# Patient Record
Sex: Female | Born: 1945
Health system: Southern US, Community
[De-identification: ages and names within clinical notes are randomized; demographics above are authoritative.]

## PROBLEM LIST (undated history)

## (undated) DIAGNOSIS — F419 Anxiety disorder, unspecified: Secondary | ICD-10-CM

## (undated) DIAGNOSIS — K589 Irritable bowel syndrome without diarrhea: Secondary | ICD-10-CM

## (undated) DIAGNOSIS — J439 Emphysema, unspecified: Secondary | ICD-10-CM

## (undated) DIAGNOSIS — F329 Major depressive disorder, single episode, unspecified: Secondary | ICD-10-CM

## (undated) DIAGNOSIS — E785 Hyperlipidemia, unspecified: Secondary | ICD-10-CM

## (undated) DIAGNOSIS — K219 Gastro-esophageal reflux disease without esophagitis: Secondary | ICD-10-CM

## (undated) DIAGNOSIS — K449 Diaphragmatic hernia without obstruction or gangrene: Secondary | ICD-10-CM

## (undated) DIAGNOSIS — K5790 Diverticulosis of intestine, part unspecified, without perforation or abscess without bleeding: Secondary | ICD-10-CM

## (undated) DIAGNOSIS — I10 Essential (primary) hypertension: Secondary | ICD-10-CM

## (undated) DIAGNOSIS — I4891 Unspecified atrial fibrillation: Secondary | ICD-10-CM

## (undated) DIAGNOSIS — F32A Depression, unspecified: Secondary | ICD-10-CM

## (undated) HISTORY — DX: Diaphragmatic hernia without obstruction or gangrene: K44.9

## (undated) HISTORY — DX: Emphysema, unspecified: J43.9

## (undated) HISTORY — DX: Gastro-esophageal reflux disease without esophagitis: K21.9

## (undated) HISTORY — DX: Hyperlipidemia, unspecified: E78.5

## (undated) HISTORY — PX: TUBAL LIGATION: SHX77

## (undated) HISTORY — PX: COLONOSCOPY: SHX174

## (undated) HISTORY — PX: PARTIAL HYSTERECTOMY: SHX80

## (undated) HISTORY — DX: Anxiety disorder, unspecified: F41.9

## (undated) HISTORY — DX: Diverticulosis of intestine, part unspecified, without perforation or abscess without bleeding: K57.90

## (undated) HISTORY — DX: Unspecified atrial fibrillation: I48.91

## (undated) HISTORY — DX: Irritable bowel syndrome, unspecified: K58.9

## (undated) HISTORY — PX: TONSILLECTOMY: SUR1361

---

## 1999-01-02 ENCOUNTER — Emergency Department (HOSPITAL_COMMUNITY): Admission: EM | Admit: 1999-01-02 | Discharge: 1999-01-02 | Payer: Self-pay | Admitting: Emergency Medicine

## 1999-04-17 ENCOUNTER — Other Ambulatory Visit: Admission: RE | Admit: 1999-04-17 | Discharge: 1999-04-17 | Payer: Self-pay | Admitting: Internal Medicine

## 2000-01-04 ENCOUNTER — Encounter: Payer: Self-pay | Admitting: Internal Medicine

## 2000-01-04 ENCOUNTER — Encounter: Admission: RE | Admit: 2000-01-04 | Discharge: 2000-01-04 | Payer: Self-pay | Admitting: Internal Medicine

## 2000-05-21 ENCOUNTER — Other Ambulatory Visit: Admission: RE | Admit: 2000-05-21 | Discharge: 2000-05-21 | Payer: Self-pay | Admitting: Internal Medicine

## 2001-02-26 ENCOUNTER — Encounter: Admission: RE | Admit: 2001-02-26 | Discharge: 2001-02-26 | Payer: Self-pay | Admitting: Internal Medicine

## 2001-02-26 ENCOUNTER — Encounter: Payer: Self-pay | Admitting: Internal Medicine

## 2003-08-30 ENCOUNTER — Other Ambulatory Visit: Admission: RE | Admit: 2003-08-30 | Discharge: 2003-08-30 | Payer: Self-pay | Admitting: Family Medicine

## 2003-09-23 ENCOUNTER — Ambulatory Visit (HOSPITAL_COMMUNITY): Admission: RE | Admit: 2003-09-23 | Discharge: 2003-09-23 | Payer: Self-pay | Admitting: Family Medicine

## 2005-03-11 ENCOUNTER — Encounter: Admission: RE | Admit: 2005-03-11 | Discharge: 2005-03-11 | Payer: Self-pay | Admitting: Obstetrics and Gynecology

## 2009-04-11 ENCOUNTER — Other Ambulatory Visit: Admission: RE | Admit: 2009-04-11 | Discharge: 2009-04-11 | Payer: Self-pay | Admitting: Family Medicine

## 2009-04-11 ENCOUNTER — Encounter: Admission: RE | Admit: 2009-04-11 | Discharge: 2009-04-11 | Payer: Self-pay | Admitting: Family Medicine

## 2009-04-17 ENCOUNTER — Encounter: Admission: RE | Admit: 2009-04-17 | Discharge: 2009-04-17 | Payer: Self-pay | Admitting: Family Medicine

## 2009-09-05 ENCOUNTER — Encounter: Admission: RE | Admit: 2009-09-05 | Discharge: 2009-09-05 | Payer: Self-pay | Admitting: Family Medicine

## 2009-09-07 ENCOUNTER — Ambulatory Visit (HOSPITAL_COMMUNITY): Admission: RE | Admit: 2009-09-07 | Discharge: 2009-09-07 | Payer: Self-pay | Admitting: Family Medicine

## 2011-02-19 ENCOUNTER — Other Ambulatory Visit: Payer: Self-pay | Admitting: Family Medicine

## 2011-02-20 ENCOUNTER — Ambulatory Visit
Admission: RE | Admit: 2011-02-20 | Discharge: 2011-02-20 | Disposition: A | Discharge status: Home / Self Care | Payer: Medicare Other | Source: Ambulatory Visit | Attending: Family Medicine | Admitting: Family Medicine

## 2011-02-20 MED ORDER — IOHEXOL 300 MG/ML  SOLN
100.0000 mL | Freq: Once | INTRAMUSCULAR | Status: AC | PRN
  Administered 2011-02-20: 100 mL via INTRAVENOUS

## 2011-02-25 ENCOUNTER — Other Ambulatory Visit: Payer: Self-pay | Admitting: Family Medicine

## 2011-02-25 DIAGNOSIS — N289 Disorder of kidney and ureter, unspecified: Secondary | ICD-10-CM

## 2011-02-27 ENCOUNTER — Other Ambulatory Visit: Payer: Medicare Other

## 2011-03-02 ENCOUNTER — Ambulatory Visit
Admission: RE | Admit: 2011-03-02 | Discharge: 2011-03-02 | Disposition: A | Discharge status: Home / Self Care | Payer: Medicare Other | Source: Ambulatory Visit | Attending: Family Medicine | Admitting: Family Medicine

## 2011-03-02 DIAGNOSIS — N289 Disorder of kidney and ureter, unspecified: Secondary | ICD-10-CM

## 2011-03-02 MED ORDER — GADOBENATE DIMEGLUMINE 529 MG/ML IV SOLN
12.0000 mL | Freq: Once | INTRAVENOUS | Status: AC | PRN
  Administered 2011-03-02: 12 mL via INTRAVENOUS

## 2011-11-22 ENCOUNTER — Emergency Department (HOSPITAL_COMMUNITY)
Admission: EM | Admit: 2011-11-22 | Discharge: 2011-11-22 | Disposition: A | Discharge status: Home / Self Care | Payer: Medicare Other | Attending: Emergency Medicine | Admitting: Emergency Medicine

## 2011-11-22 ENCOUNTER — Encounter (HOSPITAL_COMMUNITY): Payer: Self-pay | Admitting: *Deleted

## 2011-11-22 ENCOUNTER — Emergency Department (HOSPITAL_COMMUNITY): Payer: Medicare Other

## 2011-11-22 DIAGNOSIS — R42 Dizziness and giddiness: Secondary | ICD-10-CM

## 2011-11-22 DIAGNOSIS — R109 Unspecified abdominal pain: Secondary | ICD-10-CM | POA: Insufficient documentation

## 2011-11-22 DIAGNOSIS — Z79899 Other long term (current) drug therapy: Secondary | ICD-10-CM | POA: Insufficient documentation

## 2011-11-22 DIAGNOSIS — K922 Gastrointestinal hemorrhage, unspecified: Secondary | ICD-10-CM

## 2011-11-22 DIAGNOSIS — I10 Essential (primary) hypertension: Secondary | ICD-10-CM | POA: Insufficient documentation

## 2011-11-22 DIAGNOSIS — R269 Unspecified abnormalities of gait and mobility: Secondary | ICD-10-CM | POA: Insufficient documentation

## 2011-11-22 DIAGNOSIS — R10816 Epigastric abdominal tenderness: Secondary | ICD-10-CM | POA: Insufficient documentation

## 2011-11-22 HISTORY — DX: Depression, unspecified: F32.A

## 2011-11-22 HISTORY — DX: Major depressive disorder, single episode, unspecified: F32.9

## 2011-11-22 HISTORY — DX: Essential (primary) hypertension: I10

## 2011-11-22 LAB — CBC
HCT: 41.4 % (ref 36.0–46.0)
Hemoglobin: 14 g/dL (ref 12.0–15.0)
MCHC: 34.4 g/dL (ref 30.0–36.0)
MCHC: 33.8 g/dL (ref 30.0–36.0)
Platelets: 221 10*3/uL (ref 150–400)
RBC: 4.44 MIL/uL (ref 3.87–5.11)
RDW: 12.9 % (ref 11.5–15.5)
WBC: 11.9 10*3/uL — ABNORMAL HIGH (ref 4.0–10.5)

## 2011-11-22 LAB — BASIC METABOLIC PANEL
BUN: 13 mg/dL (ref 6–23)
CO2: 29 mEq/L (ref 19–32)
Chloride: 102 mEq/L (ref 96–112)
Creatinine, Ser: 0.74 mg/dL (ref 0.50–1.10)
Glucose, Bld: 100 mg/dL — ABNORMAL HIGH (ref 70–99)
Potassium: 3.6 mEq/L (ref 3.5–5.1)

## 2011-11-22 LAB — COMPREHENSIVE METABOLIC PANEL
ALT: 12 U/L (ref 0–35)
AST: 14 U/L (ref 0–37)
Calcium: 10.1 mg/dL (ref 8.4–10.5)
Creatinine, Ser: 0.84 mg/dL (ref 0.50–1.10)
GFR calc Af Amer: 82 mL/min — ABNORMAL LOW (ref >90)
GFR calc non Af Amer: 70 mL/min — ABNORMAL LOW (ref >90)
Glucose, Bld: 101 mg/dL — ABNORMAL HIGH (ref 70–99)
Sodium: 143 mEq/L (ref 135–145)
Total Protein: 7.3 g/dL (ref 6.0–8.3)

## 2011-11-22 LAB — DIFFERENTIAL
Basophils Relative: 0 % (ref 0–1)
Lymphs Abs: 2.8 10*3/uL (ref 0.7–4.0)
Monocytes Absolute: 0.7 10*3/uL (ref 0.1–1.0)
Monocytes Relative: 6 % (ref 3–12)
Neutro Abs: 7.7 10*3/uL (ref 1.7–7.7)
Neutrophils Relative %: 67 % (ref 43–77)

## 2011-11-22 LAB — SAMPLE TO BLOOD BANK

## 2011-11-22 MED ORDER — GADOBENATE DIMEGLUMINE 529 MG/ML IV SOLN
15.0000 mL | Freq: Once | INTRAVENOUS | Status: AC | PRN
  Administered 2011-11-22: 15 mL via INTRAVENOUS

## 2011-11-22 MED ORDER — SODIUM CHLORIDE 0.9 % IV SOLN
INTRAVENOUS | Status: DC
  Administered 2011-11-22: 19:00:00 via INTRAVENOUS

## 2011-11-22 MED ORDER — MECLIZINE HCL 25 MG PO TABS
25.0000 mg | ORAL_TABLET | Freq: Once | ORAL | Status: AC
  Administered 2011-11-22: 25 mg via ORAL
  Filled 2011-11-22: qty 1

## 2011-11-22 MED ORDER — PANTOPRAZOLE SODIUM 40 MG IV SOLR
40.0000 mg | Freq: Once | INTRAVENOUS | Status: AC
  Administered 2011-11-22: 40 mg via INTRAVENOUS
  Filled 2011-11-22 (×2): qty 40

## 2011-11-22 MED ORDER — MECLIZINE HCL 50 MG PO TABS: 25.0000 mg | ORAL_TABLET | Freq: Three times a day (TID) | ORAL | Status: AC | PRN

## 2011-11-22 MED ORDER — ONDANSETRON HCL 4 MG/2ML IJ SOLN
4.0000 mg | Freq: Once | INTRAMUSCULAR | Status: AC
  Administered 2011-11-22: 4 mg via INTRAVENOUS
  Filled 2011-11-22 (×2): qty 2

## 2011-11-22 NOTE — ED Notes (Signed)
Pt reports dizziness with vomiting. Stroke scale negative. Pt drinking something from [employer], pt has given it to husband.

## 2011-11-22 NOTE — ED Notes (Signed)
Pt reports waking up feeling dizzy.  Vomiting started around 14:30.  She has thrown up everything she has eaten today which she reports as "pot pie, breakfast sandwich, and hot chocolate."  The last thing she vomited was blood. Called her PCP and he recommended that she come to the ER.

## 2011-11-22 NOTE — ED Notes (Signed)
Pt states that she has been nauseated and vomited today on the second time she vomited she states that she vomited blood

## 2011-11-22 NOTE — ED Notes (Addendum)
Pt walked to bathroom on Pod A with no trouble from room 6. Pt states that on her way back to room 6 she felt dizzy and was unable to walk without support. EDP Caparossi aware and examed pt. Pt able to follow commands and perform tests, but when walking she was stumbling. Spoke to MRI aware pt is here and needs MRI of brain.

## 2011-11-22 NOTE — ED Provider Notes (Addendum)
History     CSN: [id number]  Arrival date & time 11/22/11  1535   First MD Initiated Contact with Patient 11/22/11 1727      Chief Complaint  Patient presents with  . GI Bleeding  . Dizziness    (Consider location/radiation/quality/duration/timing/severity/associated sxs/prior treatment) The history is provided by the patient and the spouse.   the patient is a 66 year old, female, with a history of hypertension, and osteoporosis, who presents emergency department complaining of dizziness with several episodes of nausea, vomiting, and a single episode of copious, hematemesis.  She says she woke up with these sxs.  She has mild abdominal discomfort, as well.  She takes Fosamax for her osteoporosis.  She has never had peptic ulcer disease in the past.  She denies alcohol use.  She denies recent illness.  She has not noted blood in her stool.  She smokes.  She denies ha or vision changes.  She says when she walks she feels "swimmy headed."  Past Medical History  Diagnosis Date  . Depression   . Hypertension     Past Surgical History  Procedure Date  . Tonsillectomy     History reviewed. No pertinent family history.  History  Substance Use Topics  . Smoking status: Current Everyday Smoker    Types: Cigarettes  . Smokeless tobacco: Not on file  . Alcohol Use: No    OB History    Grav Para Term Preterm Abortions TAB SAB Ect Mult Living                  Review of Systems  Constitutional: Negative for fever, chills and diaphoresis.  HENT: Negative for ear pain.   Respiratory: Negative for cough, chest tightness and shortness of breath.   Cardiovascular: Negative for chest pain.  Gastrointestinal: Positive for nausea, vomiting and abdominal pain. Negative for blood in stool.       Mild abdominal pain  Genitourinary: Negative for dysuria.  Neurological: Positive for dizziness and light-headedness. Negative for headaches.       Dizziness is lightheadedness.  It is not  vertigo  Psychiatric/Behavioral: Negative for confusion.  All other systems reviewed and are negative.    Allergies  Wellbutrin  Home Medications   Current Outpatient Rx  Name Route Sig Dispense Refill  . ALENDRONATE SODIUM 70 MG PO TABS Oral Take 70 mg by mouth every 7 (seven) days. Sundays or Mondays per pt; Take with a full glass of water on an empty stomach.    Marland Kitchen CITALOPRAM HYDROBROMIDE 10 MG PO TABS Oral Take 10 mg by mouth at bedtime.    Marland Kitchen LISINOPRIL 20 MG PO TABS Oral Take 20 mg by mouth every morning.      BP 130/97  Pulse 67  Temp(Src) 98.9 F (37.2 C) (Oral)  Resp 18  SpO2 100%  Physical Exam  Vitals reviewed. Constitutional: She is oriented to person, place, and time. She appears well-developed and well-nourished. No distress.  HENT:  Head: Normocephalic and atraumatic.  Eyes: Conjunctivae are normal.       Normal pink conjunctiva  Neck: Normal range of motion. Neck supple.  Cardiovascular: Normal rate.   No murmur heard.      2+ radial pulses bilaterally  Pulmonary/Chest: Effort normal and breath sounds normal. No respiratory distress.  Abdominal: Soft. Bowel sounds are normal. She exhibits no distension. There is tenderness.       Mild epigastric tenderness, with no peritoneal  Genitourinary: Guaiac negative stool.  Rectal examination performed with nurse chaperone  Musculoskeletal: Normal range of motion. She exhibits no edema.  Neurological: She is alert and oriented to person, place, and time. She has normal strength. No cranial nerve deficit. She displays a negative Romberg sign. Gait abnormal. Coordination normal.       Wide based unsteady gait No pronator drift No nystagmus  Skin: Skin is warm and dry.  Psychiatric: She has a normal mood and affect. Thought content normal.    ED Course  Procedures (including critical care time) 66 year old, female, with hematemesis, earlier today.  No diarrhea, and no blood in stool.  She has not had peptic  ulcer disease in the past and does not drink alcohol.  However, she does take Fosamax.  The bi phosphonate, which may cause ulcers as well We'll establish IV give proton ex and perform laboratory testing for further evaluation  Labs Reviewed  CBC - Abnormal; Notable for the following:    WBC 11.9 (*)    All other components within normal limits  COMPREHENSIVE METABOLIC PANEL - Abnormal; Notable for the following:    Glucose, Bld 101 (*)    Total Bilirubin 0.2 (*)    GFR calc non Af Amer 70 (*)    GFR calc Af Amer 82 (*)    All other components within normal limits  SAMPLE TO BLOOD BANK  OCCULT BLOOD, POC DEVICE  CBC  DIFFERENTIAL  BASIC METABOLIC PANEL   No results found.   No diagnosis found.  Pt just walked to bathroom.  Feels dizzy/unsteady.  Says she felt as if she would fall off toilet.    MDM  GI bleed dizziness        Cheri Guppy, MD 11/22/11 2002  Cheri Guppy, MD 11/22/11 2157

## 2011-11-22 NOTE — ED Notes (Signed)
Pt with hx of dizziness in the past woke up this am at 7am (possibly earlier) with dizziness.  Pt went to bed last night and did not have dizziness at that time.  Pt denies any CP or sob with this.  No neuro deficits, or focal weakness with this.  Pt is alert and oriented in triage

## 2011-11-22 NOTE — Discharge Instructions (Signed)
Your MRI, does show any signs of a stroke or tumor.  Your blood tests do not show any significant blood loss.  Decrease your citalopram.  Stop smoking.  Use meclizine for dizziness.  Followup with your doctor next week for reevaluation.  Return for worse or uncontrolled symptoms

## 2012-03-18 ENCOUNTER — Other Ambulatory Visit: Payer: Self-pay | Admitting: Family Medicine

## 2012-04-09 ENCOUNTER — Encounter (HOSPITAL_COMMUNITY): Payer: Self-pay

## 2012-04-09 ENCOUNTER — Encounter (HOSPITAL_COMMUNITY)
Admission: RE | Admit: 2012-04-09 | Discharge: 2012-04-09 | Disposition: A | Discharge status: Home / Self Care | Payer: Medicare Other | Source: Ambulatory Visit | Attending: Family Medicine | Admitting: Family Medicine

## 2012-04-09 DIAGNOSIS — M810 Age-related osteoporosis without current pathological fracture: Secondary | ICD-10-CM | POA: Insufficient documentation

## 2012-04-09 MED ORDER — SODIUM CHLORIDE 0.9 % IV SOLN
Freq: Once | INTRAVENOUS | Status: AC
  Administered 2012-04-09: 14:00:00 via INTRAVENOUS

## 2012-04-09 MED ORDER — ZOLEDRONIC ACID 5 MG/100ML IV SOLN
5.0000 mg | Freq: Once | INTRAVENOUS | Status: AC
  Administered 2012-04-09: 5 mg via INTRAVENOUS
  Filled 2012-04-09: qty 100

## 2013-07-26 ENCOUNTER — Other Ambulatory Visit: Payer: Self-pay

## 2013-07-26 DIAGNOSIS — Z1231 Encounter for screening mammogram for malignant neoplasm of breast: Secondary | ICD-10-CM

## 2013-07-27 ENCOUNTER — Ambulatory Visit
Admission: RE | Admit: 2013-07-27 | Discharge: 2013-07-27 | Disposition: A | Discharge status: Home / Self Care | Payer: Medicare Other | Source: Ambulatory Visit | Attending: Family Medicine | Admitting: Family Medicine

## 2013-07-27 ENCOUNTER — Other Ambulatory Visit: Payer: Self-pay | Admitting: Family Medicine

## 2013-07-27 DIAGNOSIS — R059 Cough, unspecified: Secondary | ICD-10-CM

## 2013-07-27 DIAGNOSIS — R05 Cough: Secondary | ICD-10-CM

## 2013-08-20 ENCOUNTER — Ambulatory Visit
Admission: RE | Admit: 2013-08-20 | Discharge: 2013-08-20 | Disposition: A | Discharge status: Home / Self Care | Payer: Medicare Other | Source: Ambulatory Visit

## 2013-08-20 DIAGNOSIS — Z1231 Encounter for screening mammogram for malignant neoplasm of breast: Secondary | ICD-10-CM

## 2013-09-30 ENCOUNTER — Other Ambulatory Visit: Payer: Self-pay | Admitting: Family Medicine

## 2013-09-30 ENCOUNTER — Other Ambulatory Visit (HOSPITAL_COMMUNITY)
Admission: RE | Admit: 2013-09-30 | Discharge: 2013-09-30 | Disposition: A | Discharge status: Home / Self Care | Payer: Medicare Other | Source: Ambulatory Visit | Attending: Family Medicine | Admitting: Family Medicine

## 2013-09-30 DIAGNOSIS — Z124 Encounter for screening for malignant neoplasm of cervix: Secondary | ICD-10-CM | POA: Insufficient documentation

## 2014-02-28 ENCOUNTER — Other Ambulatory Visit: Payer: Self-pay | Admitting: Gastroenterology

## 2014-02-28 DIAGNOSIS — R1032 Left lower quadrant pain: Secondary | ICD-10-CM

## 2014-03-03 ENCOUNTER — Ambulatory Visit
Admission: RE | Admit: 2014-03-03 | Discharge: 2014-03-03 | Disposition: A | Discharge status: Home / Self Care | Payer: Medicare Other | Source: Ambulatory Visit | Attending: Gastroenterology | Admitting: Gastroenterology

## 2014-03-03 DIAGNOSIS — R1032 Left lower quadrant pain: Secondary | ICD-10-CM

## 2014-03-03 MED ORDER — IOHEXOL 300 MG/ML  SOLN
100.0000 mL | Freq: Once | INTRAMUSCULAR | Status: AC | PRN
  Administered 2014-03-03: 100 mL via INTRAVENOUS

## 2016-08-10 ENCOUNTER — Encounter (HOSPITAL_COMMUNITY): Payer: Self-pay | Admitting: Emergency Medicine

## 2016-08-10 ENCOUNTER — Emergency Department (HOSPITAL_COMMUNITY)
Admission: EM | Admit: 2016-08-10 | Discharge: 2016-08-11 | Disposition: A | Discharge status: Home / Self Care | Payer: Medicare Other | Attending: Emergency Medicine | Admitting: Emergency Medicine

## 2016-08-10 ENCOUNTER — Emergency Department (HOSPITAL_COMMUNITY): Payer: Medicare Other

## 2016-08-10 DIAGNOSIS — K5793 Diverticulitis of intestine, part unspecified, without perforation or abscess with bleeding: Secondary | ICD-10-CM | POA: Diagnosis not present

## 2016-08-10 DIAGNOSIS — I10 Essential (primary) hypertension: Secondary | ICD-10-CM | POA: Insufficient documentation

## 2016-08-10 DIAGNOSIS — R1032 Left lower quadrant pain: Secondary | ICD-10-CM

## 2016-08-10 DIAGNOSIS — F17210 Nicotine dependence, cigarettes, uncomplicated: Secondary | ICD-10-CM | POA: Diagnosis not present

## 2016-08-10 DIAGNOSIS — Z79899 Other long term (current) drug therapy: Secondary | ICD-10-CM | POA: Insufficient documentation

## 2016-08-10 DIAGNOSIS — K5792 Diverticulitis of intestine, part unspecified, without perforation or abscess without bleeding: Secondary | ICD-10-CM

## 2016-08-10 LAB — CBC WITH DIFFERENTIAL/PLATELET
BASOS ABS: 0 10*3/uL (ref 0.0–0.1)
Basophils Relative: 0 %
Eosinophils Absolute: 0.4 10*3/uL (ref 0.0–0.7)
Eosinophils Relative: 5 %
HEMATOCRIT: 42.4 % (ref 36.0–46.0)
HEMOGLOBIN: 14.4 g/dL (ref 12.0–15.0)
LYMPHS PCT: 19 %
Lymphs Abs: 1.8 10*3/uL (ref 0.7–4.0)
MCH: 31.7 pg (ref 26.0–34.0)
MCHC: 34 g/dL (ref 30.0–36.0)
MCV: 93.4 fL (ref 78.0–100.0)
Monocytes Absolute: 1 10*3/uL (ref 0.1–1.0)
Monocytes Relative: 10 %
NEUTROS ABS: 6.3 10*3/uL (ref 1.7–7.7)
NEUTROS PCT: 66 %
Platelets: 215 10*3/uL (ref 150–400)
RBC: 4.54 MIL/uL (ref 3.87–5.11)
RDW: 13.2 % (ref 11.5–15.5)
WBC: 9.5 10*3/uL (ref 4.0–10.5)

## 2016-08-10 LAB — BASIC METABOLIC PANEL
Anion gap: 10 (ref 5–15)
BUN: 11 mg/dL (ref 6–20)
CO2: 25 mmol/L (ref 22–32)
Calcium: 9.2 mg/dL (ref 8.9–10.3)
Chloride: 105 mmol/L (ref 101–111)
Creatinine, Ser: 0.67 mg/dL (ref 0.44–1.00)
GFR calc Af Amer: >60 mL/min (ref >60)
GFR calc non Af Amer: >60 mL/min (ref >60)
Glucose, Bld: 108 mg/dL — ABNORMAL HIGH (ref 65–99)
Potassium: 3.9 mmol/L (ref 3.5–5.1)
Sodium: 140 mmol/L (ref 135–145)

## 2016-08-10 LAB — LIPASE, BLOOD: Lipase: 19 U/L (ref 11–51)

## 2016-08-10 LAB — POC OCCULT BLOOD, ED: Fecal Occult Bld: NEGATIVE

## 2016-08-10 LAB — I-STAT CG4 LACTIC ACID, ED: LACTIC ACID, VENOUS: 0.97 mmol/L (ref 0.5–1.9)

## 2016-08-10 MED ORDER — MORPHINE SULFATE (PF) 4 MG/ML IV SOLN
4.0000 mg | Freq: Once | INTRAVENOUS | Status: DC
  Filled 2016-08-10: qty 1

## 2016-08-10 MED ORDER — SODIUM CHLORIDE 0.9 % IV BOLUS (SEPSIS)
1000.0000 mL | Freq: Once | INTRAVENOUS | Status: AC
  Administered 2016-08-10: 1000 mL via INTRAVENOUS

## 2016-08-10 MED ORDER — IOPAMIDOL (ISOVUE-300) INJECTION 61%
INTRAVENOUS | Status: AC
  Filled 2016-08-10: qty 100

## 2016-08-10 NOTE — ED Notes (Signed)
Pt states she is not having much pain right now and would like to wait on the pain medication. RN showed patient how to call if she starts hurting and that she could have the pain medication whenever she was ready.

## 2016-08-10 NOTE — ED Notes (Signed)
Called lab and added on basic metabolic panel

## 2016-08-10 NOTE — ED Provider Notes (Signed)
Complains of left lower quadrant pain since approximately Christmas 2017. Waxes and wanes. Nothing makes symptoms better or worse she's had 2 or 3 episodes of vomiting since Christmas. Last episode of vomiting today. No hematemesis she also reports 1 episode of diarrhea today no blood per rectum. Maximum temperature 100.1. Presently discomfort is mild. Denies any nausea on exam she is alert and in no distress abdomen nondistended normal active bowel sounds, minimal tenderness of left lower quadrant no guarding rigidity or rebound   Orlie Dakin, MD 08/10/16 2201

## 2016-08-10 NOTE — ED Triage Notes (Signed)
Pt. Stated, I've had my diverticulitis to flare a month ago and its still there. I have an appt. On the 23 but I don't think I can wait thAT LONG. Im having diarrhea and left side abdominal pain

## 2016-08-10 NOTE — ED Provider Notes (Signed)
[facility] DEPT Provider Note   CSN: [id number] Arrival date & time: 08/10/16  1755     History   Chief Complaint Chief Complaint  Patient presents with  . Abdominal Pain    left side  . Diarrhea  . Diverticulitis    HPI URVI BLOCKER is a 71 y.o. female.  HPI   71 year old female presents today with complaints of abdominal pain. Patient reports a long-standing history of irritable bowel syndrome, and reported diverticulitis. Patient notes that she's had approximately 3-4 episodes of irritable bowel per year, but notes since Christmas of 2017 she started have persistent symptoms. She reports pain in her left lower quadrant with intermittent diarrhea and constipation. Patient notes she's had several episodes of vomiting since Christmas, most recently today. Patient notes a normal bowel movement followed by diarrhea today. She denies any blood in her vomit or in her stool. Patient reports the pain is often severe, very minimal at the time of evaluation. Patient reports a low-grade fever between 99 and 100.1, she denies any present nausea. Patient reports that she's currently taking by [employer].   Past Medical History:  Diagnosis Date  . Depression   . Hypertension     There are no active problems to display for this patient.   Past Surgical History:  Procedure Laterality Date  . TONSILLECTOMY      OB History    No data available       Home Medications    Prior to Admission medications   Medication Sig Start Date End Date Taking? Authorizing Provider  Ascorbic Acid (VITAMIN C PO) Take 1 tablet by mouth daily.   Yes Historical Provider, MD  CALCIUM-MAGNESIUM-VITAMIN D PO Take 1 tablet by mouth daily.   Yes Historical Provider, MD  citalopram (CELEXA) 20 MG tablet Take 20 mg by mouth daily.   Yes Historical Provider, MD  lisinopril (PRINIVIL,ZESTRIL) 20 MG tablet Take 20 mg by mouth every morning.   Yes Historical Provider, MD  ciprofloxacin (CIPRO) 500 MG  tablet Take 1 tablet (500 mg total) by mouth 2 (two) times daily. 08/11/16   Okey Regal, PA-C  HYDROcodone-acetaminophen (NORCO/VICODIN) 5-325 MG tablet Take 1 tablet by mouth every 4 (four) hours as needed. 08/11/16   Okey Regal, PA-C  metroNIDAZOLE (FLAGYL) 500 MG tablet Take 1 tablet (500 mg total) by mouth 2 (two) times daily. 08/11/16   Dellis Filbert Nhung Danko, PA-C  ondansetron (ZOFRAN) 4 MG tablet Take 1 tablet (4 mg total) by mouth every 6 (six) hours. 08/11/16   Okey Regal, PA-C    Family History No family history on file.  Social History Social History  Substance Use Topics  . Smoking status: Current Every Day Smoker    Packs/day: 1.00    Types: Cigarettes  . Smokeless tobacco: Current User  . Alcohol use No     Allergies   Wellbutrin [bupropion hcl]   Review of Systems Review of Systems  All other systems reviewed and are negative.    Physical Exam Updated Vital Signs BP 161/81   Pulse 72   Temp 99.1 F (37.3 C) (Oral)   Resp 17   Ht 4' 10.75" (1.492 m)   Wt 55.4 kg   SpO2 94%   BMI 24.88 kg/m   Physical Exam  Constitutional: She is oriented to person, place, and time. She appears well-developed and well-nourished.  HENT:  Head: Normocephalic and atraumatic.  Eyes: Conjunctivae are normal. Pupils are equal, round, and reactive to light. Right eye exhibits no  discharge. Left eye exhibits no discharge. No scleral icterus.  Neck: Normal range of motion. No JVD present. No tracheal deviation present.  Pulmonary/Chest: Effort normal. No stridor.  Abdominal: Soft. Bowel sounds are normal. She exhibits no distension and no mass. There is no tenderness. There is no rebound and no guarding. No hernia.  Neurological: She is alert and oriented to person, place, and time. Coordination normal.  Skin: Skin is warm and dry.  Psychiatric: She has a normal mood and affect. Her behavior is normal. Judgment and thought content normal.  Nursing note and vitals  reviewed.    ED Treatments / Results  Labs (all labs ordered are listed, but only abnormal results are displayed) Labs Reviewed  URINALYSIS, ROUTINE W REFLEX MICROSCOPIC - Abnormal; Notable for the following:       Result Value   Color, Urine STRAW (*)    Specific Gravity, Urine >1.046 (*)    Hgb urine dipstick SMALL (*)    All other components within normal limits  BASIC METABOLIC PANEL - Abnormal; Notable for the following:    Glucose, Bld 108 (*)    All other components within normal limits  CBC WITH DIFFERENTIAL/PLATELET  LIPASE, BLOOD  I-STAT CG4 LACTIC ACID, ED  POC OCCULT BLOOD, ED    EKG  EKG Interpretation None       Radiology Ct Abdomen Pelvis W Contrast  Result Date: 08/10/2016 CLINICAL DATA:  Acute onset of left lower quadrant abdominal pain, vomiting and diarrhea. Initial encounter. EXAM: CT ABDOMEN AND PELVIS WITH CONTRAST TECHNIQUE: Multidetector CT imaging of the abdomen and pelvis was performed using the standard protocol following bolus administration of intravenous contrast. CONTRAST:  100 mL of Isovue 300 IV contrast COMPARISON:  CT of the abdomen and pelvis from 03/03/2014 FINDINGS: Lower chest: The visualized portions of the mediastinum are unremarkable. The visualized lung bases are clear. A small to moderate hiatal hernia is seen. Hepatobiliary: The liver is unremarkable in appearance. The gallbladder is unremarkable in appearance. The common bile duct remains normal in caliber. Pancreas: The pancreas is within normal limits. Spleen: The spleen is unremarkable in appearance. Adrenals/Urinary Tract: The adrenal glands are unremarkable in appearance. Scattered bilateral renal cysts are noted, more prominent on the right. No renal or ureteral stones are identified, though evaluation for stones is limited given contrast in the renal calyces. There is no evidence of hydronephrosis. No perinephric stranding is seen. Stomach/Bowel: The stomach is unremarkable in  appearance. The small bowel is within normal limits. The appendix is normal in caliber, without evidence of appendicitis. The colon is unremarkable in appearance There is scattered diverticulosis along the proximal sigmoid colon. Mild wall thickening is noted at the proximal sigmoid colon, with mild surrounding soft tissue inflammation, concerning for acute diverticulitis. There is no evidence of perforation or abscess formation at this time. Vascular/Lymphatic: Scattered calcification is seen along the abdominal aorta and its branches. The abdominal aorta is otherwise grossly unremarkable. The inferior vena cava is grossly unremarkable. No retroperitoneal lymphadenopathy is seen. No pelvic sidewall lymphadenopathy is identified. Reproductive: The bladder is mildly distended and grossly unremarkable. The uterus is unremarkable in appearance. The ovaries are grossly unremarkable in appearance, though the left ovary is not well assessed due to the adjacent diverticulitis. No suspicious adnexal masses are seen. Other: No additional soft tissue abnormalities are seen. Musculoskeletal: No acute osseous abnormalities are identified. The visualized musculature is unremarkable in appearance. IMPRESSION: 1. Acute diverticulitis at the proximal sigmoid colon, with mild surrounding soft  tissue inflammation. Underlying scattered diverticulosis. No evidence of perforation or abscess formation at this time. 2. Scattered aortic atherosclerosis. 3. Scattered bilateral renal cysts. 4. Small to moderate hiatal hernia seen. Electronically Signed   By: Garald Balding M.D.   On: 08/10/2016 23:49    Procedures Procedures (including critical care time)  Medications Ordered in ED Medications  sodium chloride 0.9 % bolus 1,000 mL (0 mLs Intravenous Stopped 08/10/16 2300)  metroNIDAZOLE (FLAGYL) tablet 500 mg (500 mg Oral Given 08/11/16 0037)  ciprofloxacin (CIPRO) tablet 500 mg (500 mg Oral Given 08/11/16 0037)     Initial  Impression / Assessment and Plan / ED Course  I have reviewed the triage vital signs and the nursing notes.  Pertinent labs & imaging results that were available during my care of the patient were reviewed by me and considered in my medical decision making (see chart for details).  Clinical Course      Final Clinical Impressions(s) / ED Diagnoses   Final diagnoses:  LLQ pain  Diverticulitis of intestine without perforation or abscess without bleeding, unspecified part of intestinal tract    Labs: Lactic acid, CBC, lipase  Imaging:CT abdomen and pelvis  Consults:  Therapeutics: Cipro metronidazole  Discharge Meds: Cipro metronidazole  Assessment/Plan:    71 year old female presents today with complaints of abdominal pain. Patient's presentation is most consistent with diverticulitis. Patient has a very mild case that she has a normal white count, and a soft benign abdomen. Patient reports recent antibiotic use which seemed to improve her symptoms upon initial intake. Patient will be started on standard protocol with Cipro and metronidazole with gastroenterology follow-up. Patient afebrile nontoxic in no acute distress, tolerating by mouth. She will be discharged home with antibiotics, close follow-up, and strict return precautions. She verbalized understanding and agreement to today's plan had no further questions or concerns at time of discharge  New Prescriptions Discharge Medication List as of 08/11/2016 12:28 AM    START taking these medications   Details  ciprofloxacin (CIPRO) 500 MG tablet Take 1 tablet (500 mg total) by mouth 2 (two) times daily., Starting Sun 08/11/2016, Print    metroNIDAZOLE (FLAGYL) 500 MG tablet Take 1 tablet (500 mg total) by mouth 2 (two) times daily., Starting Sun 08/11/2016, Print    ondansetron (ZOFRAN) 4 MG tablet Take 1 tablet (4 mg total) by mouth every 6 (six) hours., Starting Sun 08/11/2016, Print         Okey Regal, PA-C 08/12/16  [place], MD 08/13/16 [id number]

## 2016-08-10 NOTE — ED Notes (Signed)
Patient transported to CT 

## 2016-08-11 LAB — URINALYSIS, ROUTINE W REFLEX MICROSCOPIC
Bacteria, UA: NONE SEEN
Bilirubin Urine: NEGATIVE
Glucose, UA: NEGATIVE mg/dL
Ketones, ur: NEGATIVE mg/dL
Leukocytes, UA: NEGATIVE
Nitrite: NEGATIVE
Protein, ur: NEGATIVE mg/dL
Specific Gravity, Urine: >1.046 — ABNORMAL HIGH (ref 1.005–1.030)
Squamous Epithelial / LPF: NONE SEEN
pH: 6 (ref 5.0–8.0)

## 2016-08-11 MED ORDER — CIPROFLOXACIN HCL 500 MG PO TABS
500.0000 mg | ORAL_TABLET | Freq: Once | ORAL | Status: AC
  Administered 2016-08-11: 500 mg via ORAL
  Filled 2016-08-11: qty 1

## 2016-08-11 MED ORDER — ONDANSETRON HCL 4 MG PO TABS: 4.0000 mg | ORAL_TABLET | Freq: Four times a day (QID) | ORAL | 0 refills | Status: DC

## 2016-08-11 MED ORDER — METRONIDAZOLE 500 MG PO TABS
500.0000 mg | ORAL_TABLET | Freq: Once | ORAL | Status: AC
  Administered 2016-08-11: 500 mg via ORAL
  Filled 2016-08-11: qty 1

## 2016-08-11 MED ORDER — CIPROFLOXACIN HCL 500 MG PO TABS: 500.0000 mg | ORAL_TABLET | Freq: Two times a day (BID) | ORAL | 0 refills | Status: DC

## 2016-08-11 MED ORDER — HYDROCODONE-ACETAMINOPHEN 5-325 MG PO TABS: 1.0000 | ORAL_TABLET | ORAL | 0 refills | Status: DC | PRN

## 2016-08-11 MED ORDER — METRONIDAZOLE 500 MG PO TABS: 500.0000 mg | ORAL_TABLET | Freq: Two times a day (BID) | ORAL | 0 refills | Status: DC

## 2016-08-11 NOTE — Discharge Instructions (Signed)
Please read attached information. If you experience any new or worsening signs or symptoms please return to the emergency room for evaluation. Please follow-up with your primary care provider or specialist as discussed. Please use medication prescribed only as directed and discontinue taking if you have any concerning signs or symptoms.   °

## 2016-08-11 NOTE — ED Notes (Signed)
Pt understood dc material. NAD noted. Scripts given at dc 

## 2016-08-30 ENCOUNTER — Other Ambulatory Visit: Payer: Self-pay | Admitting: Gastroenterology

## 2016-08-30 DIAGNOSIS — R1084 Generalized abdominal pain: Secondary | ICD-10-CM

## 2016-08-30 DIAGNOSIS — Z8719 Personal history of other diseases of the digestive system: Secondary | ICD-10-CM

## 2016-08-30 DIAGNOSIS — R1032 Left lower quadrant pain: Secondary | ICD-10-CM

## 2016-09-02 ENCOUNTER — Ambulatory Visit
Admission: RE | Admit: 2016-09-02 | Discharge: 2016-09-02 | Disposition: A | Discharge status: Home / Self Care | Payer: Medicare Other | Source: Ambulatory Visit | Attending: Gastroenterology | Admitting: Gastroenterology

## 2016-09-02 DIAGNOSIS — Z8719 Personal history of other diseases of the digestive system: Secondary | ICD-10-CM

## 2016-09-02 DIAGNOSIS — R1032 Left lower quadrant pain: Secondary | ICD-10-CM

## 2016-09-02 DIAGNOSIS — R1084 Generalized abdominal pain: Secondary | ICD-10-CM

## 2016-09-02 MED ORDER — IOPAMIDOL (ISOVUE-300) INJECTION 61%
100.0000 mL | Freq: Once | INTRAVENOUS | Status: AC | PRN
  Administered 2016-09-02: 100 mL via INTRAVENOUS

## 2016-10-24 DIAGNOSIS — D126 Benign neoplasm of colon, unspecified: Secondary | ICD-10-CM

## 2016-10-24 HISTORY — DX: Benign neoplasm of colon, unspecified: D12.6

## 2016-11-22 ENCOUNTER — Other Ambulatory Visit: Payer: Self-pay | Admitting: Family Medicine

## 2016-11-22 ENCOUNTER — Telehealth: Payer: Self-pay

## 2016-11-22 DIAGNOSIS — M8580 Other specified disorders of bone density and structure, unspecified site: Secondary | ICD-10-CM

## 2016-11-22 DIAGNOSIS — Z1231 Encounter for screening mammogram for malignant neoplasm of breast: Secondary | ICD-10-CM

## 2016-11-22 NOTE — Telephone Encounter (Signed)
SENT NOTES TO SCHEDULING 

## 2016-12-27 ENCOUNTER — Ambulatory Visit: Payer: Medicare Other | Admitting: Cardiology

## 2016-12-31 ENCOUNTER — Encounter: Payer: Self-pay | Admitting: Cardiology

## 2017-02-12 ENCOUNTER — Ambulatory Visit
Admission: RE | Admit: 2017-02-12 | Discharge: 2017-02-12 | Disposition: A | Discharge status: Home / Self Care | Payer: Medicare Other | Source: Ambulatory Visit | Attending: Family Medicine | Admitting: Family Medicine

## 2017-02-12 ENCOUNTER — Ambulatory Visit: Payer: Medicare Other

## 2017-02-12 DIAGNOSIS — Z1231 Encounter for screening mammogram for malignant neoplasm of breast: Secondary | ICD-10-CM

## 2017-02-12 DIAGNOSIS — M8580 Other specified disorders of bone density and structure, unspecified site: Secondary | ICD-10-CM

## 2017-06-06 ENCOUNTER — Ambulatory Visit: Payer: Medicare Other | Admitting: Podiatry

## 2017-06-06 ENCOUNTER — Encounter: Payer: Self-pay | Admitting: Podiatry

## 2017-06-06 VITALS — BP 142/67 | HR 75 | Resp 16

## 2017-06-06 DIAGNOSIS — M6740 Ganglion, unspecified site: Secondary | ICD-10-CM

## 2017-06-06 NOTE — Progress Notes (Signed)
   Subjective:    Patient ID: Abigail Aguirre, female    DOB: June 10, 1946, 71 y.o.   MRN: [id number]  HPI    Review of Systems  All other systems reviewed and are negative.      Objective:   Physical Exam        Assessment & Plan:

## 2017-06-09 NOTE — Progress Notes (Signed)
Subjective:    Patient ID: Abigail Aguirre, female   DOB: 71 y.o.   MRN: [id number]   HPI patient presents on the fourth digit right with lesion on the distal interphalangeal joint with fluid buildup and pain when palpated. Patient states that it's been going on for a while and gradually becoming more irritated    Review of Systems  All other systems reviewed and are negative.       Objective:  Physical Exam  Constitutional: She appears well-developed and well-nourished.  Cardiovascular: Intact distal pulses.  Pulmonary/Chest: Effort normal.  Musculoskeletal: Normal range of motion.  Neurological: She is alert.  Skin: Skin is warm.  Nursing note and vitals reviewed.  neurovascular status intact muscle strength adequate range of motion within normal limits with patient found to have a small cyst on the interphalangeal joint digit for right medial side that's painful when pressed and has slight redness surrounding it with rotation. Has good digital perfusion well oriented 3     Assessment:    Inflammatory mucoid cyst digit for right distal joint medial side     Plan:    H&P performed condition reviewed proximal nerve block administered and I went ahead and aspirated the lesion getting out a gelatinous material. I then applied compression and instructed on compressing this at home and patient will be seen back to recheck again  X-rays indicating narrowing of the joint surface with no other signs of pathology

## 2017-12-08 IMAGING — CT CT ABD-PELV W/ CM
2 of 5 series · 15 of 46 positions shown, 17 images · IV contrast (APPLIED)
Comparison: CT of the abdomen and pelvis from 03/03/2014

CLINICAL DATA: Acute onset of left lower quadrant abdominal pain,
vomiting and diarrhea. Initial encounter.

EXAM:
CT ABDOMEN AND PELVIS WITH CONTRAST
TECHNIQUE: Multidetector CT imaging of the abdomen and pelvis was performed
using the standard protocol following bolus administration of
intravenous contrast.
CONTRAST:  100 mL of Isovue 300 IV contrast

[Series 2: abd/ pelvis 5.0 i30f 1 · axial · 0.61mm/px · z∈[-1085,-745]mm · 12 of 78 slices shown, 14 images]
[im 5/78  soft-tissue]
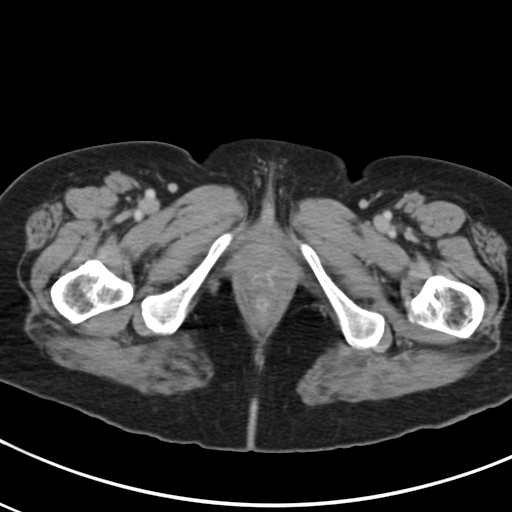
[im 5/78  bone]
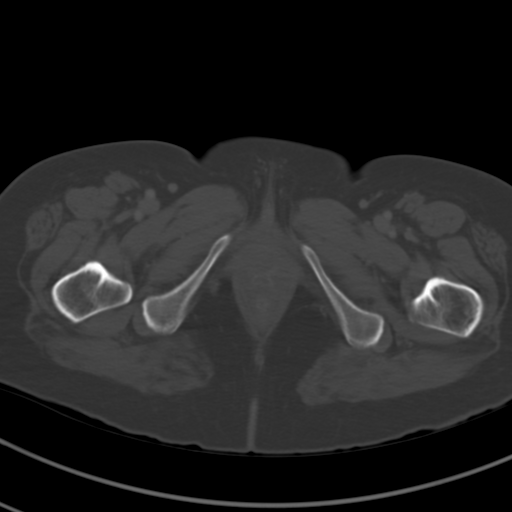
[im 13/78  soft-tissue]
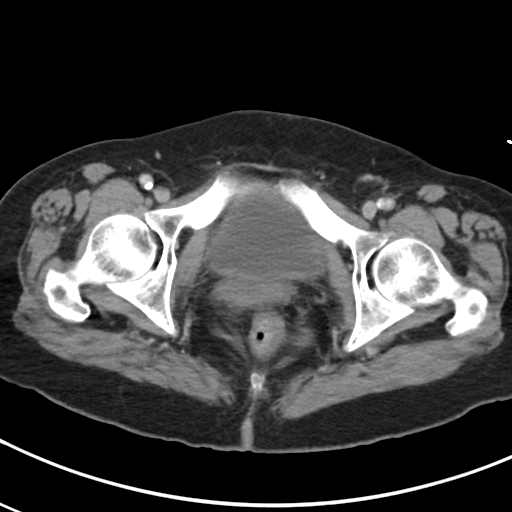
[im 18/78  soft-tissue]
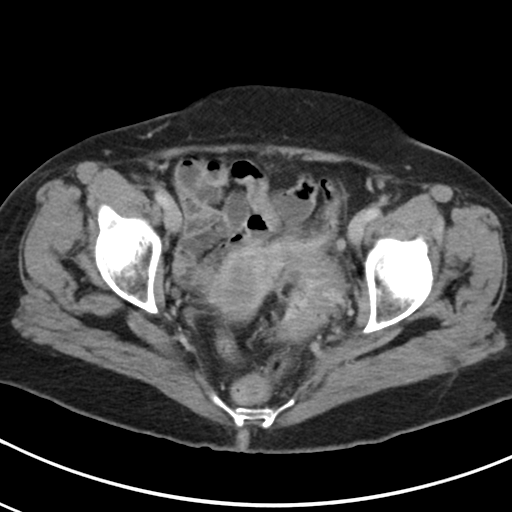
[im 22/78  soft-tissue]
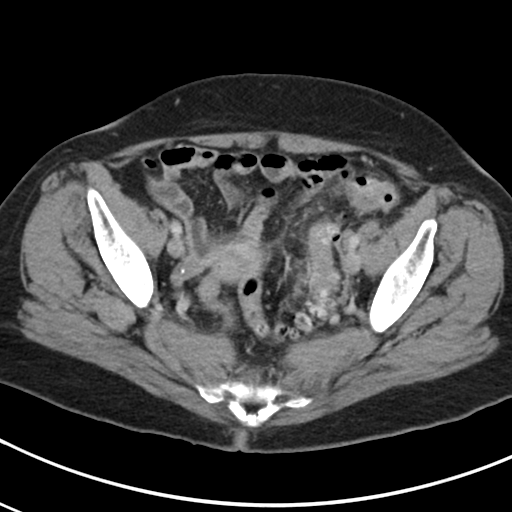
[im 30/78  soft-tissue]
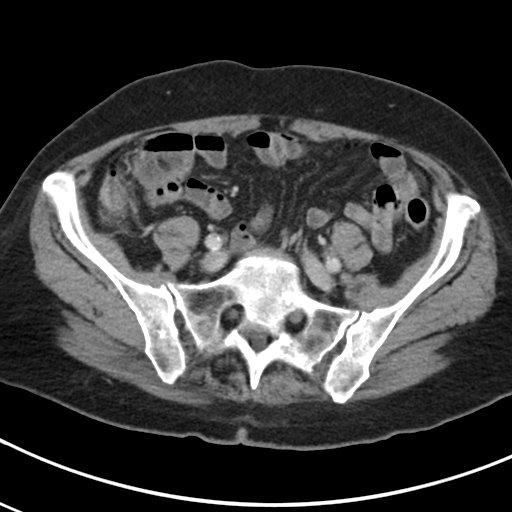
[im 35/78  soft-tissue]
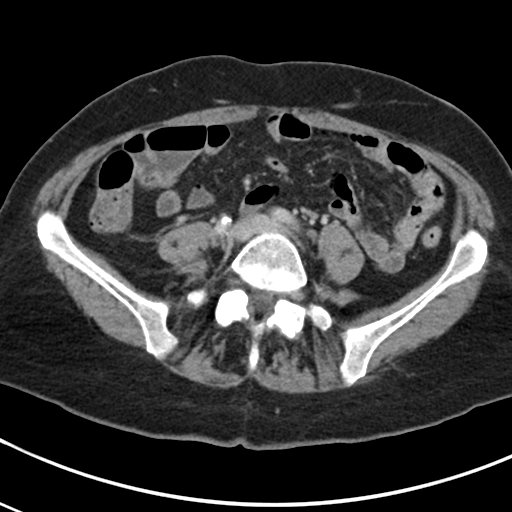
[im 43/78  soft-tissue]
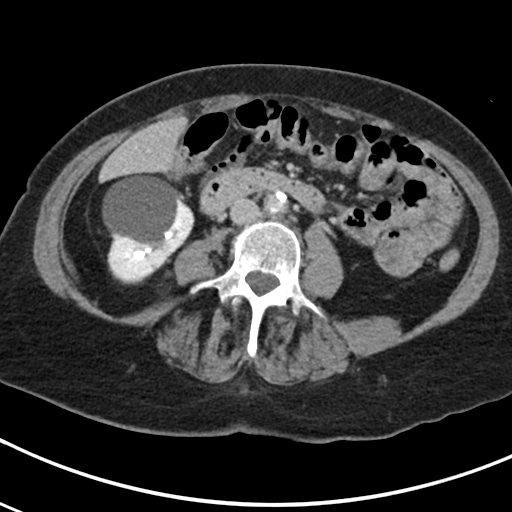
[im 48/78  soft-tissue]
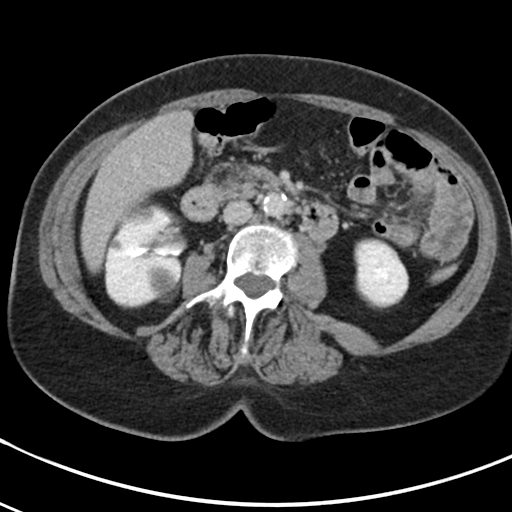
[im 56/78  soft-tissue]
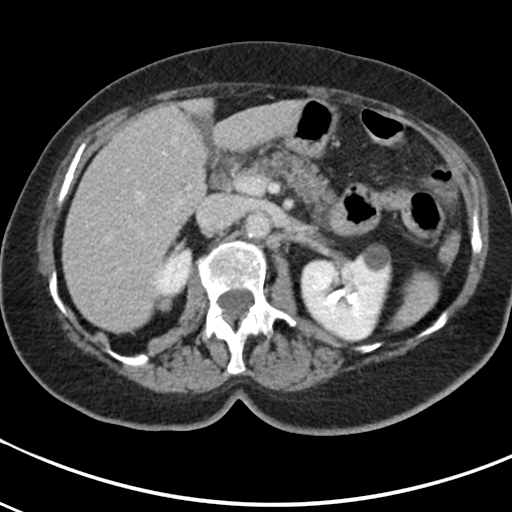
[im 56/78  bone]
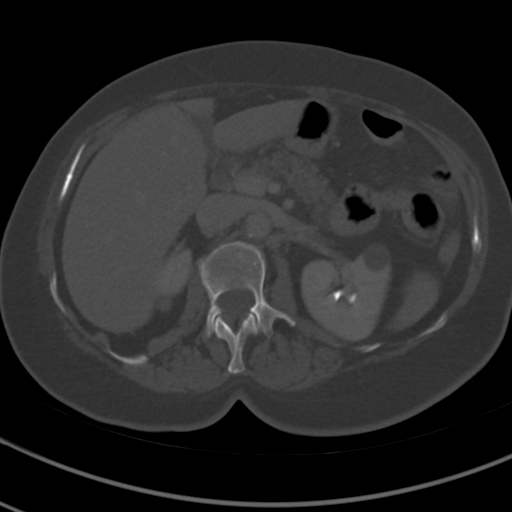
[im 60/78  soft-tissue]
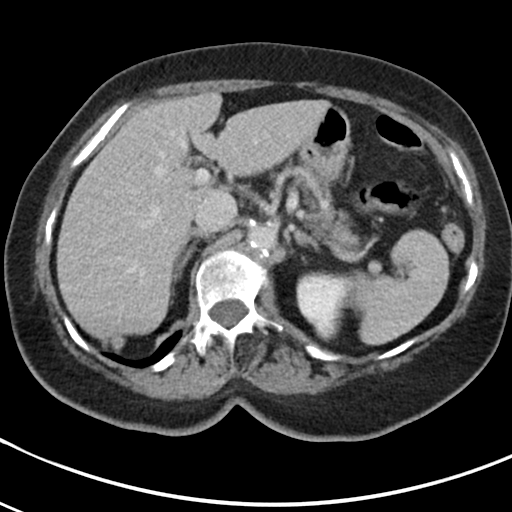
[im 65/78  soft-tissue]
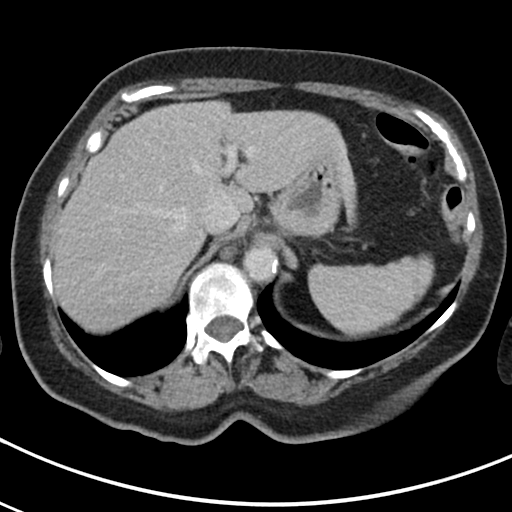
[im 73/78  soft-tissue]
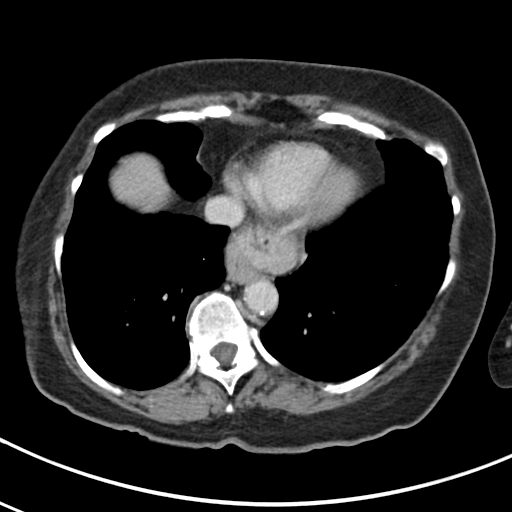

[Series 4: coronal soft tissue · coronal · 0.65mm/px · 3 of 82 slices shown]
[im 28/82  soft-tissue]
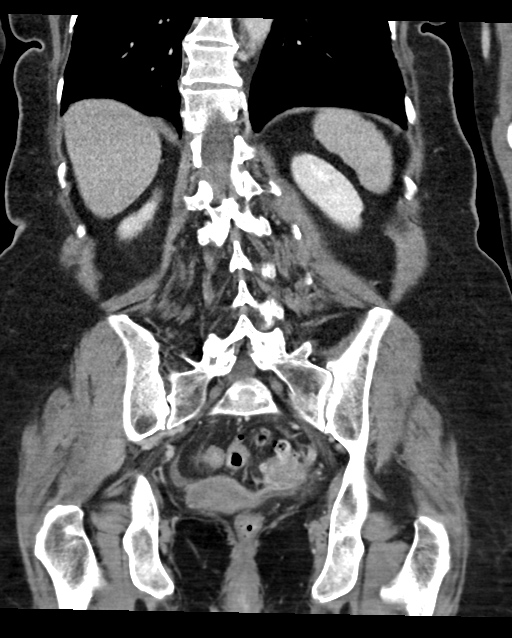
[im 37/82  soft-tissue]
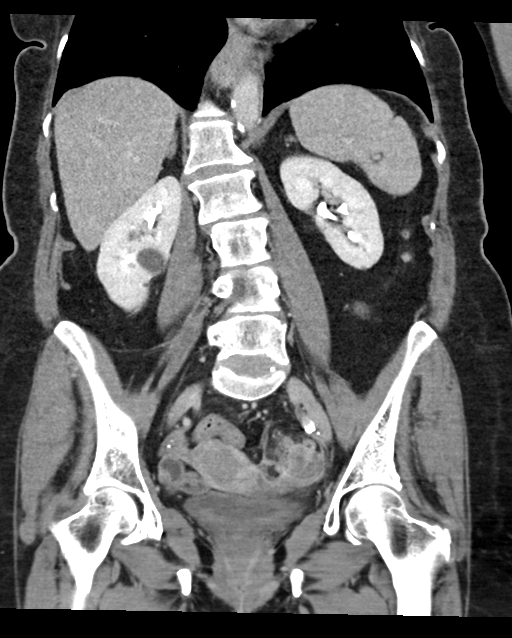
[im 46/82  soft-tissue]
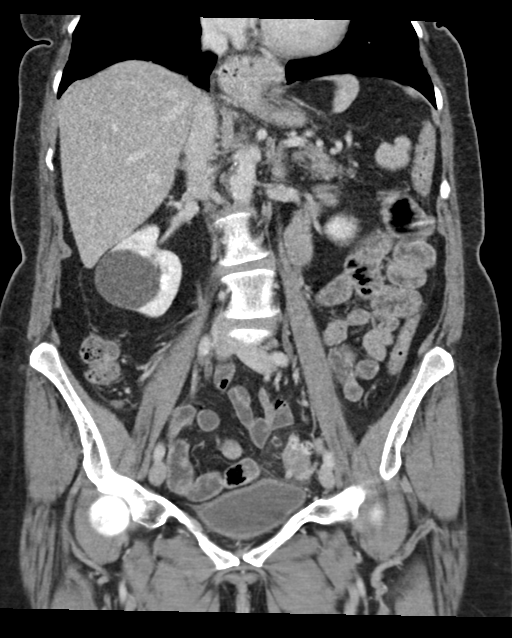

[15 of 46 positions shown; findings below may reference images not displayed]

FINDINGS: Lower chest: The visualized portions of the mediastinum are
unremarkable. The visualized lung bases are clear. A small to
moderate hiatal hernia is seen.

Hepatobiliary: The liver is unremarkable in appearance. The
gallbladder is unremarkable in appearance. The common bile duct
remains normal in caliber.

Pancreas: The pancreas is within normal limits.

Spleen: The spleen is unremarkable in appearance.

Adrenals/Urinary Tract: The adrenal glands are unremarkable in
appearance.

Scattered bilateral renal cysts are noted, more prominent on the
right. No renal or ureteral stones are identified, though evaluation
for stones is limited given contrast in the renal calyces. There is
no evidence of hydronephrosis. No perinephric stranding is seen.

Stomach/Bowel: The stomach is unremarkable in appearance. The small
bowel is within normal limits. The appendix is normal in caliber,
without evidence of appendicitis. The colon is unremarkable in
appearance

There is scattered diverticulosis along the proximal sigmoid colon.
Mild wall thickening is noted at the proximal sigmoid colon, with
mild surrounding soft tissue inflammation, concerning for acute
diverticulitis. There is no evidence of perforation or abscess
formation at this time.

Vascular/Lymphatic: Scattered calcification is seen along the
abdominal aorta and its branches. The abdominal aorta is otherwise
grossly unremarkable. The inferior vena cava is grossly
unremarkable. No retroperitoneal lymphadenopathy is seen. No pelvic
sidewall lymphadenopathy is identified.

Reproductive: The bladder is mildly distended and grossly
unremarkable. The uterus is unremarkable in appearance. The ovaries
are grossly unremarkable in appearance, though the left ovary is not
well assessed due to the adjacent diverticulitis. No suspicious
adnexal masses are seen.

Other: No additional soft tissue abnormalities are seen.

Musculoskeletal: No acute osseous abnormalities are identified. The
visualized musculature is unremarkable in appearance.
IMPRESSION: 1. Acute diverticulitis at the proximal sigmoid colon, with mild
surrounding soft tissue inflammation. Underlying scattered
diverticulosis. No evidence of perforation or abscess formation at
this time.
2. Scattered aortic atherosclerosis.
3. Scattered bilateral renal cysts.
4. Small to moderate hiatal hernia seen.

## 2019-01-07 ENCOUNTER — Other Ambulatory Visit: Payer: Self-pay | Admitting: Family Medicine

## 2019-01-07 DIAGNOSIS — M810 Age-related osteoporosis without current pathological fracture: Secondary | ICD-10-CM

## 2019-03-24 ENCOUNTER — Ambulatory Visit
Admission: RE | Admit: 2019-03-24 | Discharge: 2019-03-24 | Disposition: A | Discharge status: Home / Self Care | Payer: Medicare Other | Source: Ambulatory Visit | Attending: Family Medicine | Admitting: Family Medicine

## 2019-03-24 ENCOUNTER — Other Ambulatory Visit: Payer: Self-pay

## 2019-03-24 DIAGNOSIS — M810 Age-related osteoporosis without current pathological fracture: Secondary | ICD-10-CM

## 2019-11-04 ENCOUNTER — Encounter: Payer: Self-pay | Admitting: Dermatology

## 2019-11-04 ENCOUNTER — Other Ambulatory Visit: Payer: Self-pay

## 2019-11-04 ENCOUNTER — Ambulatory Visit: Payer: Medicare Other | Admitting: Dermatology

## 2019-11-04 DIAGNOSIS — L409 Psoriasis, unspecified: Secondary | ICD-10-CM

## 2019-11-04 MED ORDER — RISANKIZUMAB-RZAA(150 MG DOSE) 75 MG/0.83ML ~~LOC~~ PSKT
150.0000 mg | PREFILLED_SYRINGE | Freq: Once | SUBCUTANEOUS | Status: AC
Start: 2019-11-04 — End: 2019-11-04
  Administered 2019-11-04: 150 mg via SUBCUTANEOUS

## 2019-11-04 NOTE — Progress Notes (Signed)
   Follow-Up Visit   Subjective  Abigail Aguirre is a 74 y.o. female who presents for the following: Psoriasis f/u (smaller areas are improving, hand is still painful and scaly. Using Calcipotriene and mupirocin to hand, and betamethasone to all other areas. Pt. feels skyrizi is improving her symptoms.).   The following portions of the chart were reviewed this encounter and updated as appropriate: Tobacco  Allergies  Meds  Problems  Med Hx  Surg Hx  Fam Hx      Review of Systems: No other skin or systemic complaints.  Objective  Well appearing patient in no apparent distress; mood and affect are within normal limits.  A focused examination was performed including upper extremities, including the arms, hands, fingers, and fingernails, head, including the scalp, face, neck, nose, ears, eyelids, and lips and chest, axillae, abdomen, back. Relevant physical exam findings are noted in the Assessment and Plan.  Objective  Inframammary Fold, Left Abdomen (side) - Lower, Left Axilla, Left Postauricular Area, Right Abdomen (side) - Upper, Right Hand - Anterior, Umbilicus: Scaly pink plaques   Assessment & Plan  Psoriasis (7) Right Hand - Anterior; Right Abdomen (side) - Upper; Left Abdomen (side) - Lower; Inframammary Fold; Left Axilla; Umbilicus; Left Postauricular Area  Reviewed chronic nature.  Currently flared but improving  Continue Calcipotriene to all affected areas on body, ears, folds  Continue hydrocortisone 2.5% twice a day as needed up to 1 week to affected areas at body folds  Restart betamethasone twice a day as needed to affected areas x 1 week on the L palm and other affected areas on body  Recommend Gold Bond Rapid Relief Anti-Itch cream up to 3 times per day to areas that are itchy.  Skyrizi due today, Patient received Skyrizi injection today  Lot [id number]  Exp 12/2020. Patient tolerated well  Topical steroids (such as triamcinolone, fluocinolone,  fluocinonide, mometasone, clobetasol, halobetasol, betamethasone, hydrocortisone) can cause thinning and lightening of the skin if they are used for too long in the same area. Your physician has selected the right strength medicine for your problem and area affected on the body. Please use your medication only as directed by your physician to prevent side effects.   Patient educated today on how to give her own Skyrizi injection, Patient watched how to give injection first then coached on how to give the second injection to herself. Advised she can come for a nurse visit in July if she is not comfortable doing the injection herself.  Return in about 3 months (around 02/03/2020) for [facility] in 3 months nurse visit,  f/u in June  .   IDonzetta Kohut, CMA, am acting as scribe for Forest Gleason, MD .  Documentation: I have reviewed the above documentation for accuracy and completeness, and I agree with the above.  Forest Gleason, MD

## 2019-11-04 NOTE — Patient Instructions (Addendum)
Continue Calcipotriene to all affected areas on body, ears, folds  Continue hydrocortisone 2.5% twice a day as needed up to 1 week to affected areas at body folds  Restart betamethasone twice a day as needed to affected areas x 1 week on the L palm and other affected areas on body  Recommend Gold Bond Rapid Relief Anti-Itch cream up to 3 times per day to areas that are itchy.  Topical steroids (such as triamcinolone, fluocinolone, fluocinonide, mometasone, clobetasol, halobetasol, betamethasone, hydrocortisone) can cause thinning and lightening of the skin if they are used for too long in the same area. Your physician has selected the right strength medicine for your problem and area affected on the body. Please use your medication only as directed by your physician to prevent side effects.

## 2019-12-20 ENCOUNTER — Other Ambulatory Visit: Payer: Self-pay | Admitting: *Deleted

## 2019-12-20 DIAGNOSIS — Z87891 Personal history of nicotine dependence: Secondary | ICD-10-CM

## 2019-12-20 DIAGNOSIS — F17210 Nicotine dependence, cigarettes, uncomplicated: Secondary | ICD-10-CM

## 2020-01-07 ENCOUNTER — Encounter: Payer: Self-pay | Admitting: Gastroenterology

## 2020-01-17 ENCOUNTER — Other Ambulatory Visit: Payer: Self-pay

## 2020-01-17 ENCOUNTER — Ambulatory Visit: Payer: Medicare Other | Admitting: Dermatology

## 2020-01-17 DIAGNOSIS — L409 Psoriasis, unspecified: Secondary | ICD-10-CM

## 2020-01-17 MED ORDER — DUOBRII 0.01-0.045 % EX LOTN: 1.0000 "application " | TOPICAL_LOTION | Freq: Every day | CUTANEOUS | 2 refills | Status: DC

## 2020-01-17 MED ORDER — TACROLIMUS 0.1 % EX OINT: TOPICAL_OINTMENT | CUTANEOUS | 3 refills | Status: DC

## 2020-01-17 NOTE — Progress Notes (Signed)
   Follow-Up Visit   Subjective  Abigail Aguirre is a 74 y.o. female who presents for the following: Psoriasis (hands,trunk, arms, legs, skyrizi x 2 injections, betamethasone, HC 2.5%, Calcipotriene, in past has tried Cosentyx and didnt help).  Couldn't tolerate Otezla. Has also tried Psychologist, prison and probation services.  Has used Halobetesol cream.  No joint pain.  She has diagnosis of osteoarthritis by rheum.  She isn't much better with [employer].  R hand still very scaly and itchy.  No side effects from injection noted.   The following portions of the chart were reviewed this encounter and updated as appropriate:      Review of Systems:  No other skin or systemic complaints except as noted in HPI or Assessment and Plan.  Objective  Well appearing patient in no apparent distress; mood and affect are within normal limits.  All skin waist up examined.  Objective  Left Suprapubic Area, Axilla, Inframammary, back, R hand: Lower abdomen erythematous patches with maceration, light pink brown patches inframammary, axilla, spinal lower back, pink scaly plaque, R hand palm with extensive hyperkeratosis   Assessment & Plan  Psoriasis Left Suprapubic Area, Axilla, Inframammary, back, R hand  Slightly improved, but still extensive skin involvement with itching, especially R hand  Cont Skyrizi sq injections q 12 wks for at least 6 months, if not improving after 6 months may switch to a different biologic  Restart Xtrac Laser 2-3x/wk, focus on R hand, lower back  Start Duobrii lotion qhs to hands and body qhs to aa psoriasis, avoid face, groin, axilla, samples x 4 Lot [id number] exp 11/22  Start Protopic 0.1% oint qd/bid aa psoriasis under breast, in groin and on face prn flares  Start Eucerin roughness relief spot txt qd/bid to hands  Cont Betamethasone foam qd/bid prn during day for itching, avoid face, groin, axilla  D/c Calcipotriene cr D/c Hydrocortisone 2.5% cr    Halobetasol Prop-Tazarotene (DUOBRII)  0.01-0.045 % LOTN - Left Suprapubic Area, Axilla, Inframammary, back, R hand  tacrolimus (PROTOPIC) 0.1 % ointment - Left Suprapubic Area, Axilla, Inframammary, back, R hand  Return in about 2 months (around 03/18/2020) for psoriasis.   I, Othelia Pulling, RMA, am acting as scribe for Brendolyn Patty, MD .  Documentation: I have reviewed the above documentation for accuracy and completeness, and I agree with the above.  Brendolyn Patty MD

## 2020-01-17 NOTE — Patient Instructions (Addendum)
Duobrii at night to hand and back  NOT to face, groin, axilla  Betamethasone foam, may use during day 1 to 2 times a day as needed for itching to hand, avoid face, groin, axilla  Eucerin Roughness relief, apply to hand in the morning  Tacrolimus 0.1% ointment 1 to 2 times a day to groin, under breast, under arms and face as needed  Stop the hydrocortisone cream 2.5%

## 2020-01-19 ENCOUNTER — Encounter: Payer: Self-pay | Admitting: Acute Care

## 2020-01-19 ENCOUNTER — Ambulatory Visit
Admission: RE | Admit: 2020-01-19 | Discharge: 2020-01-19 | Disposition: A | Discharge status: Home / Self Care | Payer: Medicare Other | Source: Ambulatory Visit | Attending: Acute Care | Admitting: Acute Care

## 2020-01-19 ENCOUNTER — Ambulatory Visit (INDEPENDENT_AMBULATORY_CARE_PROVIDER_SITE_OTHER): Payer: Medicare Other | Admitting: Acute Care

## 2020-01-19 ENCOUNTER — Other Ambulatory Visit: Payer: Self-pay

## 2020-01-19 DIAGNOSIS — F17210 Nicotine dependence, cigarettes, uncomplicated: Secondary | ICD-10-CM

## 2020-01-19 DIAGNOSIS — Z122 Encounter for screening for malignant neoplasm of respiratory organs: Secondary | ICD-10-CM

## 2020-01-19 DIAGNOSIS — Z87891 Personal history of nicotine dependence: Secondary | ICD-10-CM

## 2020-01-19 NOTE — Progress Notes (Signed)
Shared Decision Making Visit Lung Cancer Screening Program [phone])   Eligibility:  Age 73 y.o.  Pack Years Smoking History Calculation 40 pack year smoking history (# packs/per year x # years smoked)  Recent History of coughing up blood  no  Unexplained weight loss? no ( >Than 15 pounds within the last 6 months )  Prior History Lung / other cancer no (Diagnosis within the last 5 years already requiring surveillance chest CT Scans).  Smoking Status Current Smoker  Former Smokers: Years since quit:NA  Quit Date: NA  Visit Components:  Discussion included one or more decision making aids. yes  Discussion included risk/benefits of screening. yes  Discussion included potential follow up diagnostic testing for abnormal scans. yes  Discussion included meaning and risk of over diagnosis. yes  Discussion included meaning and risk of False Positives. yes  Discussion included meaning of total radiation exposure. yes  Counseling Included:  Importance of adherence to annual lung cancer LDCT screening. yes  Impact of comorbidities on ability to participate in the program. yes  Ability and willingness to under diagnostic treatment. yes  Smoking Cessation Counseling:  Current Smokers:   Discussed importance of smoking cessation. yes  Information about tobacco cessation classes and interventions provided to patient. yes  Patient provided with "ticket" for LDCT Scan. yes  Symptomatic Patient. no  Counseling  Diagnosis Code: Tobacco Use Z72.0  Asymptomatic Patient yes  Counseling (Intermediate counseling: > three minutes counseling) S9702  Former Smokers:   Discussed the importance of maintaining cigarette abstinence. yes  Diagnosis Code: Personal History of Nicotine Dependence. O37.858  Information about tobacco cessation classes and interventions provided to patient. Yes  Patient provided with "ticket" for LDCT Scan. yes  Written Order for Lung Cancer  Screening with LDCT placed in Epic. Yes (CT Chest Lung Cancer Screening Low Dose W/O CM) [id number] Z12.2-Screening of respiratory organs Z87.891-Personal history of nicotine dependence   I have spent 25 minutes of face to face time with Ms. Zimmermann discussing the risks and benefits of lung cancer screening. We viewed a power point together that explained in detail the above noted topics. We paused at intervals to allow for questions to be asked and answered to ensure understanding.We discussed that the single most powerful action that she can take to decrease her risk of developing lung cancer is to quit smoking. We discussed whether or not she is ready to commit to setting a quit date. We discussed options for tools to aid in quitting smoking including nicotine replacement therapy, non-nicotine medications, support groups, Quit Smart classes, and behavior modification. We discussed that often times setting smaller, more achievable goals, such as eliminating 1 cigarette a day for a week and then 2 cigarettes a day for a week can be helpful in slowly decreasing the number of cigarettes smoked. This allows for a sense of accomplishment as well as providing a clinical benefit. I gave her the " Be Stronger Than Your Excuses" card with contact information for community resources, classes, free nicotine replacement therapy, and access to mobile apps, text messaging, and on-line smoking cessation help. I have also given her my card and contact information in the event she needs to contact me. We discussed the time and location of the scan, and that either Doroteo Glassman RN or I will call with the results within 24-48 hours of receiving them. I have offered her  a copy of the power point we viewed  as a resource in the event they need reinforcement of  the concepts we discussed today in the office. The patient verbalized understanding of all of  the above and had no further questions upon leaving the office. They have my  contact information in the event they have any further questions.  I spent 3 minutes counseling on smoking cessation and the health risks of continued tobacco abuse.She is not currently interested in smoking cessation. She will call 1-800-quit now for free nicotine replacement therapy.  I explained to the patient that there has been a high incidence of coronary artery disease noted on these exams. I explained that this is a non-gated exam therefore degree or severity cannot be determined. This patient is currently on statin therapy. I have asked the patient to follow-up with their PCP regarding any incidental finding of coronary artery disease and management with diet or medication as their PCP  feels is clinically indicated. The patient verbalized understanding of the above and had no further questions upon completion of the visit.     Magdalen Spatz, NP 01/19/2020

## 2020-01-19 NOTE — Patient Instructions (Signed)
Thank you for participating in the [facility] [place] Cancer Screening Program. It was our pleasure to meet you today. We will call you with the results of your scan within the next few days. Your scan will be assigned a Lung RADS category score by the physicians reading the scans.  This Lung RADS score determines follow up scanning.  See below for description of categories, and follow up screening recommendations. We will be in touch to schedule your follow up screening annually or based on recommendations of our providers. We will fax a copy of your scan results to your Primary Care Physician, or the physician who referred you to the program, to ensure they have the results. Please call the office if you have any questions or concerns regarding your scanning experience or results.  Our office number is [phone]. Please speak with Denise Phelps, RN. She is our Lung Cancer Screening RN. If she is unavailable when you call, please have the office staff send her a message. She will return your call at her earliest convenience. Remember, if your scan is normal, we will scan you annually as long as you continue to meet the criteria for the program. (Age 55-77, Current smoker or smoker who has quit within the last 15 years). If you are a smoker, remember, quitting is the single most powerful action that you can take to decrease your risk of lung cancer and other pulmonary, breathing related problems. We know quitting is hard, and we are here to help.  Please let us know if there is anything we can do to help you meet your goal of quitting. If you are a former smoker, congratulations. We are proud of you! Remain smoke free! Remember you can refer friends or family members through the number above.  We will screen them to make sure they meet criteria for the program. Thank you for helping us take better care of you by participating in Lung Screening.  Lung RADS Categories:  Lung RADS 1: no nodules  or definitely non-concerning nodules.  Recommendation is for a repeat annual scan in 12 months.  Lung RADS 2:  nodules that are non-concerning in appearance and behavior with a very low likelihood of becoming an active cancer. Recommendation is for a repeat annual scan in 12 months.  Lung RADS 3: nodules that are probably non-concerning , includes nodules with a low likelihood of becoming an active cancer.  Recommendation is for a 6-month repeat screening scan. Often noted after an upper respiratory illness. We will be in touch to make sure you have no questions, and to schedule your 6-month scan.  Lung RADS 4 A: nodules with concerning findings, recommendation is most often for a follow up scan in 3 months or additional testing based on our provider's assessment of the scan. We will be in touch to make sure you have no questions and to schedule the recommended 3 month follow up scan.  Lung RADS 4 B:  indicates findings that are concerning. We will be in touch with you to schedule additional diagnostic testing based on our provider's  assessment of the scan.   

## 2020-01-20 ENCOUNTER — Other Ambulatory Visit: Payer: Self-pay | Admitting: *Deleted

## 2020-01-20 DIAGNOSIS — F17210 Nicotine dependence, cigarettes, uncomplicated: Secondary | ICD-10-CM

## 2020-01-20 DIAGNOSIS — Z87891 Personal history of nicotine dependence: Secondary | ICD-10-CM

## 2020-01-20 NOTE — Progress Notes (Signed)
Please call patient and let them  know their  low dose Ct was read as a Lung RADS 2: nodules that are benign in appearance and behavior with a very low likelihood of becoming a clinically active cancer due to size or lack of growth. Recommendation per radiology is for a repeat LDCT in 12 months. .Please let them  know we will order and schedule their  annual screening scan for 12/2020. Please let them  know there was notation of CAD on their  scan.  Please remind the patient  that this is a non-gated exam therefore degree or severity of disease  cannot be determined. Please have them  follow up with their PCP regarding potential risk factor modification, dietary therapy or pharmacologic therapy if clinically indicated. Pt.  is  currently on statin therapy. Please place order for annual  screening scan for 12/2020 and fax results to PCP. Thanks so much. 

## 2020-02-01 ENCOUNTER — Other Ambulatory Visit: Payer: Self-pay

## 2020-02-01 ENCOUNTER — Ambulatory Visit: Payer: Medicare Other

## 2020-02-01 DIAGNOSIS — L400 Psoriasis vulgaris: Secondary | ICD-10-CM | POA: Diagnosis not present

## 2020-02-01 MED ORDER — RISANKIZUMAB-RZAA(150 MG DOSE) 75 MG/0.83ML ~~LOC~~ PSKT
150.0000 mg | PREFILLED_SYRINGE | Freq: Once | SUBCUTANEOUS | Status: AC
  Administered 2020-02-01: 150 mg via SUBCUTANEOUS

## 2020-02-01 NOTE — Progress Notes (Signed)
Patient here today for 12 week Skyrizi injections.   Skyrizi 75mg  X2 syringes injected SQ into the right and left abdomen. Patient tolerated well.  Patient is going to come here for injections.   LOT: [id number] EXP: [id number]

## 2020-02-01 NOTE — Patient Instructions (Signed)
Risankizumab What is this medicine? RISANKIZUMAB (RIS an KIZ ue mab) is used to treat plaque psoriasis. This medicine may be used for other purposes; ask your health care provider or pharmacist if you have questions. COMMON BRAND NAME(S): Skyrizi What should I tell my health care provider before I take this medicine? They need to know if you have any of these conditions:  immune system problems  infection  recently received or are scheduled to receive a vaccine  tuberculosis, a positive skin test for tuberculosis, or have recently been in close contact with someone who has tuberculosis  an unusual or allergic reaction to risankizumab, other medicines, foods, dyes or preservatives  pregnant or trying to get pregnant  breast-feeding How should I use this medicine? This medicine is for injection under the skin. It is usually given by a health care professional in a hospital or clinic setting. If you get this medicine at home, you will be taught how to prepare and give this medicine. Use exactly as directed. Take your medicine at regular intervals. Do not take your medicine more often than directed. It is important that you put your used needles and syringes in a special sharps container. Do not put them in a trash can. If you do not have a sharps container, call your pharmacist or healthcare provider to get one. A special MedGuide will be given to you by the pharmacist with each prescription and refill. Be sure to read this information carefully each time. Talk to your pediatrician regarding the use of this medicine in children. Special care may be needed. Overdosage: If you think you have taken too much of this medicine contact a poison control center or emergency room at once. NOTE: This medicine is only for you. Do not share this medicine with others. What if I miss a dose? If you miss a dose, take it as soon as you can. Then, continue dosing at the regularly scheduled time. What may  interact with this medicine? Do not take this medicine with any of the following medications:  live virus vaccines This medicine may also interact with the following medications:  inactivated vaccines This list may not describe all possible interactions. Give your health care provider a list of all the medicines, herbs, non-prescription drugs, or dietary supplements you use. Also tell them if you smoke, drink alcohol, or use illegal drugs. Some items may interact with your medicine. What should I watch for while using this medicine? Tell your doctor or health care professional if your symptoms do not start to get better or if they get worse. You will be tested for tuberculosis (TB) before you start this medicine. If your doctor prescribes any medicine for TB, you should start taking the TB medicine before starting this medicine. Make sure to finish the full course of TB medicine. Call your doctor or health care professional for advice if you get a fever, chills or sore throat, or other symptoms of a cold or flu. Do not treat yourself. This drug decreases your body's ability to fight infections. Try to avoid being around people who are sick. This medicine can decrease the response to a vaccine. If you need to get vaccinated, tell your health care professional if you have received this medicine. Extra booster doses may be needed. Talk to your doctor to see if a different vaccination schedule is needed. What side effects may I notice from receiving this medicine? Side effects that you should report to your doctor or health care  professional as soon as possible:  allergic reactions like skin rash; itching or hives; swelling of the face, lips, or tongue  sign and symptoms of an infection like fever or chills; cough; sore throat; pain or trouble passing urine  swollen lymph nodes in the neck, underarm, or groin areas  unusually weak or tired  breathing problems  unexplained weight loss Side  effects that usually do not require medical attention (report these to your doctor or health care professional if they continue or are bothersome):  headache  pain, redness, or irritation at site where injected  tiredness This list may not describe all possible side effects. Call your doctor for medical advice about side effects. You may report side effects to FDA at 1-800-FDA-1088. Where should I keep my medicine? Keep out of the reach of children. Store in a refrigerator between 2 and 8 degrees C (36 and 46 degrees F) in the original carton. Do not freeze. Keep in the original carton to protect from light. Throw away any unused medicine after the expiration date. NOTE: This sheet is a summary. It may not cover all possible information. If you have questions about this medicine, talk to your doctor, pharmacist, or health care provider.  2020 Elsevier/Gold Standard (2017-11-19 15:33:11)  

## 2020-02-21 ENCOUNTER — Encounter: Payer: Self-pay | Admitting: Cardiology

## 2020-02-21 ENCOUNTER — Encounter: Payer: Self-pay | Admitting: *Deleted

## 2020-02-21 ENCOUNTER — Ambulatory Visit: Payer: Medicare Other | Admitting: Cardiology

## 2020-02-21 ENCOUNTER — Other Ambulatory Visit: Payer: Self-pay

## 2020-02-21 VITALS — BP 134/70 | HR 82 | Ht 58.75 in | Wt 129.0 lb

## 2020-02-21 DIAGNOSIS — R0789 Other chest pain: Secondary | ICD-10-CM | POA: Diagnosis not present

## 2020-02-21 DIAGNOSIS — Z720 Tobacco use: Secondary | ICD-10-CM | POA: Diagnosis not present

## 2020-02-21 DIAGNOSIS — R5383 Other fatigue: Secondary | ICD-10-CM | POA: Diagnosis not present

## 2020-02-21 DIAGNOSIS — R072 Precordial pain: Secondary | ICD-10-CM | POA: Diagnosis not present

## 2020-02-21 MED ORDER — ATORVASTATIN CALCIUM 40 MG PO TABS: 40.0000 mg | ORAL_TABLET | Freq: Every day | ORAL | 3 refills | Status: DC

## 2020-02-21 NOTE — Progress Notes (Signed)
Cardiology Office Note:    Date:  02/21/2020   ID:  Abigail Aguirre, DOB 1945/12/10, MRN [id number]  PCP:  Harlan Stains, MD  [facility] Cardiologist:  Candee Furbish, MD  [facility] Electrophysiologist:  None   Referring MD: Harlan Stains, MD     History of Present Illness:    Abigail Aguirre is a 74 y.o. female here for the evaluation of coronary artery calcification/atherosclerosis, aortic atherosclerosis seen on CT scan which demonstrated mild emphysema, fatigue with exertion to rule out possible coronary artery disease at the request of Dr. Harlan Stains.  Has a history of hypertension, hyperlipidemia, psoriasis, tobacco use using 10 cigarettes/day, depression.  Has been noticing more fatigue with exertion that is worrisome to her.  No chest discomfort at this time. Sometimes can loose breath. Left lateral lower leg hurts when pushing on it. Can happen when laying down. Not with with walking. IBS   She can have an occasional atypical chest discomfort, left-sided short duration. She has been taking a new medication, Skyrizi for her psoriasis.  She says that this is also helped her irritable bowels as well.   Had prior vertigo, unsteady on feet prior.   Outside office notes reviewed from Dr. Dema Severin.    Past Medical History:  Diagnosis Date  . Depression   . Hypertension     Past Surgical History:  Procedure Laterality Date  . TONSILLECTOMY      Current Medications: Current Meds  Medication Sig  . amLODipine (NORVASC) 5 MG tablet Take 5 mg by mouth daily.  . Ascorbic Acid (VITAMIN C PO) Take 1 tablet by mouth daily.  . ciprofloxacin (CIPRO) 500 MG tablet Take 1 tablet (500 mg total) by mouth 2 (two) times daily.  . citalopram (CELEXA) 20 MG tablet Take 20 mg by mouth daily.  Marland Kitchen escitalopram (LEXAPRO) 20 MG tablet Take 20 mg by mouth daily.  Marland Kitchen Halobetasol Prop-Tazarotene (DUOBRII) 0.01-0.045 % LOTN Apply 1 application topically at bedtime. qhs to  aa hands and body prn flares, avoid face, groin, axilla  . HYDROcodone-acetaminophen (NORCO/VICODIN) 5-325 MG tablet Take 1 tablet by mouth every 4 (four) hours as needed.  . ibandronate (BONIVA) 150 MG tablet Take 150 mg by mouth every 30 (thirty) days.  Marland Kitchen lisinopril (PRINIVIL,ZESTRIL) 20 MG tablet Take 20 mg by mouth every morning.  Marland Kitchen omeprazole (PRILOSEC) 20 MG capsule Take 20 mg by mouth daily.  . ondansetron (ZOFRAN) 4 MG tablet Take 1 tablet (4 mg total) by mouth every 6 (six) hours.  . tacrolimus (PROTOPIC) 0.1 % ointment Apply topically as directed. Qd to bid to aa psoriasis in groin, under breast, under arms, and face prn flares  . [DISCONTINUED] atorvastatin (LIPITOR) 20 MG tablet Take 20 mg by mouth daily.      Allergies:   Wellbutrin [bupropion hcl]   Social History   Socioeconomic History  . Marital status: Married    Spouse name: Not on file  . Number of children: Not on file  . Years of education: Not on file  . Highest education level: Not on file  Occupational History  . Not on file  Tobacco Use  . Smoking status: Current Every Day Smoker    Packs/day: 1.00    Years: 40.00    Pack years: 40.00    Types: Cigarettes  . Smokeless tobacco: Current User  Substance and Sexual Activity  . Alcohol use: No  . Drug use: No  . Sexual activity: Not on file  Other Topics Concern  . Not on file  Social History Narrative  . Not on file   Social Determinants of Health   Financial Resource Strain:   . Difficulty of Paying Living Expenses:   Food Insecurity:   . Worried About Charity fundraiser in the Last Year:   . Arboriculturist in the Last Year:   Transportation Needs:   . Film/video editor (Medical):   Marland Kitchen Lack of Transportation (Non-Medical):   Physical Activity:   . Days of Exercise per Week:   . Minutes of Exercise per Session:   Stress:   . Feeling of Stress :   Social Connections:   . Frequency of Communication with Friends and Family:   . Frequency  of Social Gatherings with Friends and Family:   . Attends Religious Services:   . Active Member of Clubs or Organizations:   . Attends Archivist Meetings:   Marland Kitchen Marital Status:      Family History: The patient's family history is not on file. Mother died 43. Father had strokes and MI later in life. 60-70's. Died 89  ROS:   Please see the history of present illness.     All other systems reviewed and are negative.  EKGs/Labs/Other Studies Reviewed:    CT scan of lungs noted mild emphysema and coronary artery calcifications as well as aortic atherosclerosis.  EKG:  EKG is  ordered today.  The ekg ordered today demonstrates sinus rhythm 82 with no other abnormalities.  Recent Labs: No results found for requested labs within last 8760 hours.  Recent Lipid Panel No results found for: CHOL, TRIG, HDL, CHOLHDL, VLDL, LDLCALC, LDLDIRECT  Physical Exam:    VS:  BP (!) 134/70   Pulse 82   Ht 4' 10.75" (1.492 m)   Wt 129 lb (58.5 kg)   SpO2 95%   BMI 26.28 kg/m     Wt Readings from Last 3 Encounters:  02/21/20 129 lb (58.5 kg)  08/10/16 122 lb 2 oz (55.4 kg)  04/09/12 132 lb (59.9 kg)     GEN:  Well nourished, well developed in no acute distress HEENT: Normal NECK: No JVD; No carotid bruits LYMPHATICS: No lymphadenopathy CARDIAC: RRR, no murmurs, rubs, gallops RESPIRATORY:  Clear to auscultation without rales, wheezing or rhonchi  ABDOMEN: Soft, non-tender, non-distended MUSCULOSKELETAL:  No edema; No deformity  SKIN: Warm and dry NEUROLOGIC:  Alert and oriented x 3 PSYCHIATRIC:  Normal affect   ASSESSMENT:    1. Atypical chest pain   2. Precordial pain   3. Tobacco use   4. Other fatigue    PLAN:    In order of problems listed above:  Fatigue with exertion/atypical chest pain/coronary artery calcification/aortic atherosclerosis -Given her ongoing tobacco use, psoriasis, autoimmune condition, we will go ahead and check An echocardiogram as well as  Lexiscan stress test to ensure that there is no high risk evidence of ischemia. -Continue to encourage tobacco cessation.  Hyperlipidemia -We will increase her atorvastatin from 20 mg to 40 mg, high intensity dose given her coronary artery atherosclerosis present on CT scan.  Continue with secondary prevention efforts.  Prior LDL from outside labs 139.  Triglycerides 171, creatinine 0.63 ALT 11  Lateral lower extremity leg pain -Excellent distal pulses.  Likely musculoskeletal in etiology.  No signs of thrombosis.  Medication Adjustments/Labs and Tests Ordered: Current medicines are reviewed at length with the patient today.  Concerns regarding medicines are outlined above.  Orders  Placed This Encounter  Procedures  . MYOCARDIAL PERFUSION IMAGING  . EKG 12-Lead  . ECHOCARDIOGRAM COMPLETE   Meds ordered this encounter  Medications  . atorvastatin (LIPITOR) 40 MG tablet    Sig: Take 1 tablet (40 mg total) by mouth daily.    Dispense:  90 tablet    Refill:  3    Patient Instructions  Medication Instructions:  The current medical regimen is effective;  continue present plan and medications.  *If you need a refill on your cardiac medications before your next appointment, please call your pharmacy*  Testing/Procedures: Your physician has requested that you have an echocardiogram. Echocardiography is a painless test that uses sound waves to create images of your heart. It provides your doctor with information about the size and shape of your heart and how well your heart's chambers and valves are working. This procedure takes approximately one hour. There are no restrictions for this procedure.  Your physician has requested that you have a lexiscan myoview. For further information please visit [email or web address]. Please follow instruction sheet, as given.  Follow-Up: At [facility], you and your health needs are our priority.  As part of our continuing mission to provide you with  exceptional heart care, we have created designated Provider Care Teams.  These Care Teams include your primary Cardiologist (physician) and Advanced Practice Providers (APPs -  Physician Assistants and Nurse Practitioners) who all work together to provide you with the care you need, when you need it.  We recommend signing up for the patient portal called "MyChart".  Sign up information is provided on this After Visit Summary.  MyChart is used to connect with patients for Virtual Visits (Telemedicine).  Patients are able to view lab/test results, encounter notes, upcoming appointments, etc.  Non-urgent messages can be sent to your provider as well.   To learn more about what you can do with MyChart, go to [email or web address].    Your next appointment:   12 month(s)  The format for your next appointment:   In Person  Provider:   Candee Furbish, MD   Thank you for choosing [facility]!!        Signed, Candee Furbish, MD  02/21/2020 11:58 AM    [place]

## 2020-02-21 NOTE — Patient Instructions (Signed)
Medication Instructions:  The current medical regimen is effective;  continue present plan and medications.  *If you need a refill on your cardiac medications before your next appointment, please call your pharmacy*  Testing/Procedures: Your physician has requested that you have an echocardiogram. Echocardiography is a painless test that uses sound waves to create images of your heart. It provides your doctor with information about the size and shape of your heart and how well your heart's chambers and valves are working. This procedure takes approximately one hour. There are no restrictions for this procedure.  Your physician has requested that you have a lexiscan myoview. For further information please visit [email or web address]. Please follow instruction sheet, as given.  Follow-Up: At [facility], you and your health needs are our priority.  As part of our continuing mission to provide you with exceptional heart care, we have created designated Provider Care Teams.  These Care Teams include your primary Cardiologist (physician) and Advanced Practice Providers (APPs -  Physician Assistants and Nurse Practitioners) who all work together to provide you with the care you need, when you need it.  We recommend signing up for the patient portal called "MyChart".  Sign up information is provided on this After Visit Summary.  MyChart is used to connect with patients for Virtual Visits (Telemedicine).  Patients are able to view lab/test results, encounter notes, upcoming appointments, etc.  Non-urgent messages can be sent to your provider as well.   To learn more about what you can do with MyChart, go to [email or web address].    Your next appointment:   12 month(s)  The format for your next appointment:   In Person  Provider:   Candee Furbish, MD   Thank you for choosing [facility]!!

## 2020-02-23 ENCOUNTER — Encounter: Payer: Self-pay | Admitting: *Deleted

## 2020-02-28 ENCOUNTER — Encounter: Payer: Self-pay | Admitting: Gastroenterology

## 2020-02-28 ENCOUNTER — Ambulatory Visit: Payer: Medicare Other | Admitting: Gastroenterology

## 2020-02-28 VITALS — BP 124/60 | HR 100 | Ht <= 58 in | Wt 131.5 lb

## 2020-02-28 DIAGNOSIS — K5902 Outlet dysfunction constipation: Secondary | ICD-10-CM

## 2020-02-28 DIAGNOSIS — K449 Diaphragmatic hernia without obstruction or gangrene: Secondary | ICD-10-CM | POA: Diagnosis not present

## 2020-02-28 DIAGNOSIS — K219 Gastro-esophageal reflux disease without esophagitis: Secondary | ICD-10-CM

## 2020-02-28 DIAGNOSIS — R1310 Dysphagia, unspecified: Secondary | ICD-10-CM | POA: Diagnosis not present

## 2020-02-28 MED ORDER — DICYCLOMINE HCL 10 MG PO CAPS: 10.0000 mg | ORAL_CAPSULE | Freq: Three times a day (TID) | ORAL | 0 refills | Status: DC | PRN

## 2020-02-28 NOTE — Patient Instructions (Signed)
You have been scheduled for a modified barium swallow on ________ at ________. Please arrive 15 minutes prior to your test for registration. You will go to _________ Radiology (1st Floor) for your appointment. Should you need to cancel or reschedule your appointment, please contact [phone] Abigail Mussel Hoover) or [phone] Abigail Bells Long). _____________________________________________________________________ A Modified Barium Swallow Study, or MBS, is a special x-ray that is taken to check swallowing skills. It is carried out by a Stage manager and a Psychologist, clinical (SLP). During this test, yourmouth, throat, and esophagus, a muscular tube which connects your mouth to your stomach, is checked. The test will help you, your doctor, and the SLP plan what types of foods and liquids are easier for you to swallow. The SLP will also identify positions and ways to help you swallow more easily and safely. What will happen during an MBS? You will be taken to an x-ray room and seated comfortably. You will be asked to swallow small amounts of food and liquid mixed with barium. Barium is a liquid or paste that allows images of your mouth, throat and esophagus to be seen on x-ray. The x-ray captures moving images of the food you are swallowing as it travels from your mouth through your throat and into your esophagus. This test helps identify whether food or liquid is entering your lungs (aspiration). The test also shows which part of your mouth or throat lacks strength or coordination to move the food or liquid in the right direction. This test typically takes 30 minutes to 1 hour to complete. _______________________________________________________________________   Abigail Aguirre have been scheduled for a Barium Esophogram at [facility] Radiology (1st floor of the hospital) on __________ at __________. Please arrive 15 minutes prior to your appointment for registration. Make certain not to have anything to eat or drink  3 hours prior to your test. If you need to reschedule for any reason, please contact radiology at [phone] to do so. _____________________________________________________  _____________ A barium swallow is an examination that concentrates on views of the esophagus. This tends to be a double contrast exam (barium and two liquids which, when combined, create a gas to distend the wall of the oesophagus) or single contrast (non-ionic iodine based). The study is usually tailored to your symptoms so a good history is essential. Attention is paid during the study to the form, structure and configuration of the esophagus, looking for functional disorders (such as aspiration, dysphagia, achalasia, motility and reflux) EXAMINATION You may be asked to change into a gown, depending on the type of swallow being performed. A radiologist and radiographer will perform the procedure. The radiologist will advise you of the type of contrast selected for your procedure and direct you during the exam. You will be asked to stand, sit or lie in several different positions and to hold a small amount of fluid in your mouth before being asked to swallow while the imaging is performed .In some instances you may be asked to swallow barium coated marshmallows to assess the motility of a solid food bolus. The exam can be recorded as a digital or video fluoroscopy procedure. POST PROCEDURE It will take 1-2 days for the barium to pass through your system. To facilitate this, it is important, unless otherwise directed, to increase your fluids for the next 24-48hrs and to resume your normal diet.  This test typically takes about 30 minutes to perform. __________________________________________________________________________________  We have referred you to pelvic floor therapy and they will be contacting you with  that appointment   Take Benefiber 1 teaspoon three times a day   Gastroesophageal Reflux Disease,  Adult Gastroesophageal reflux (GER) happens when acid from the stomach flows up into the tube that connects the mouth and the stomach (esophagus). Normally, food travels down the esophagus and stays in the stomach to be digested. However, when a person has GER, food and stomach acid sometimes move back up into the esophagus. If this becomes a more serious problem, the person may be diagnosed with a disease called gastroesophageal reflux disease (GERD). GERD occurs when the reflux:  Happens often.  Causes frequent or severe symptoms.  Causes problems such as damage to the esophagus. When stomach acid comes in contact with the esophagus, the acid may cause soreness (inflammation) in the esophagus. Over time, GERD may create small holes (ulcers) in the lining of the esophagus. What are the causes? This condition is caused by a problem with the muscle between the esophagus and the stomach (lower esophageal sphincter, or LES). Normally, the LES muscle closes after food passes through the esophagus to the stomach. When the LES is weakened or abnormal, it does not close properly, and that allows food and stomach acid to go back up into the esophagus. The LES can be weakened by certain dietary substances, medicines, and medical conditions, including:  Tobacco use.  Pregnancy.  Having a hiatal hernia.  Alcohol use.  Certain foods and beverages, such as coffee, chocolate, onions, and peppermint. What increases the risk? You are more likely to develop this condition if you:  Have an increased body weight.  Have a connective tissue disorder.  Use NSAID medicines. What are the signs or symptoms? Symptoms of this condition include:  Heartburn.  Difficult or painful swallowing.  The feeling of having a lump in the throat.  Abitter taste in the mouth.  Bad breath.  Having a large amount of saliva.  Having an upset or bloated stomach.  Belching.  Chest pain. Different conditions can  cause chest pain. Make sure you see your health care provider if you experience chest pain.  Shortness of breath or wheezing.  Ongoing (chronic) cough or a night-time cough.  Wearing away of tooth enamel.  Weight loss. How is this diagnosed? Your health care provider will take a medical history and perform a physical exam. To determine if you have mild or severe GERD, your health care provider may also monitor how you respond to treatment. You may also have tests, including:  A test to examine your stomach and esophagus with a small camera (endoscopy).  A test thatmeasures the acidity level in your esophagus.  A test thatmeasures how much pressure is on your esophagus.  A barium swallow or modified barium swallow test to show the shape, size, and functioning of your esophagus. How is this treated? The goal of treatment is to help relieve your symptoms and to prevent complications. Treatment for this condition may vary depending on how severe your symptoms are. Your health care provider may recommend:  Changes to your diet.  Medicine.  Surgery. Follow these instructions at home: Eating and drinking   Follow a diet as recommended by your health care provider. This may involve avoiding foods and drinks such as: ? Coffee and tea (with or without caffeine). ? Drinks that containalcohol. ? Energy drinks and sports drinks. ? Carbonated drinks or sodas. ? Chocolate and cocoa. ? Peppermint and mint flavorings. ? Garlic and onions. ? Horseradish. ? Spicy and acidic foods, including peppers, chili  powder, curry powder, vinegar, hot sauces, and barbecue sauce. ? Citrus fruit juices and citrus fruits, such as oranges, lemons, and limes. ? Tomato-based foods, such as red sauce, chili, salsa, and pizza with red sauce. ? Fried and fatty foods, such as donuts, french fries, potato chips, and high-fat dressings. ? High-fat meats, such as hot dogs and fatty cuts of red and white meats,  such as rib eye steak, sausage, ham, and bacon. ? High-fat dairy items, such as whole milk, butter, and cream cheese.  Eat small, frequent meals instead of large meals.  Avoid drinking large amounts of liquid with your meals.  Avoid eating meals during the 2-3 hours before bedtime.  Avoid lying down right after you eat.  Do not exercise right after you eat. Lifestyle   Do not use any products that contain nicotine or tobacco, such as cigarettes, e-cigarettes, and chewing tobacco. If you need help quitting, ask your health care provider.  Try to reduce your stress by using methods such as yoga or meditation. If you need help reducing stress, ask your health care provider.  If you are overweight, reduce your weight to an amount that is healthy for you. Ask your health care provider for guidance about a safe weight loss goal. General instructions  Pay attention to any changes in your symptoms.  Take over-the-counter and prescription medicines only as told by your health care provider. Do not take aspirin, ibuprofen, or other NSAIDs unless your health care provider told you to do so.  Wear loose-fitting clothing. Do not wear anything tight around your waist that causes pressure on your abdomen.  Raise (elevate) the head of your bed about 6 inches (15 cm).  Avoid bending over if this makes your symptoms worse.  Keep all follow-up visits as told by your health care provider. This is important. Contact a health care provider if:  You have: ? New symptoms. ? Unexplained weight loss. ? Difficulty swallowing or it hurts to swallow. ? Wheezing or a persistent cough. ? A hoarse voice.  Your symptoms do not improve with treatment. Get help right away if you:  Have pain in your arms, neck, jaw, teeth, or back.  Feel sweaty, dizzy, or light-headed.  Have chest pain or shortness of breath.  Vomit and your vomit looks like blood or coffee grounds.  Faint.  Have stool that is  bloody or black.  Cannot swallow, drink, or eat. Summary  Gastroesophageal reflux happens when acid from the stomach flows up into the esophagus. GERD is a disease in which the reflux happens often, causes frequent or severe symptoms, or causes problems such as damage to the esophagus.  Treatment for this condition may vary depending on how severe your symptoms are. Your health care provider may recommend diet and lifestyle changes, medicine, or surgery.  Contact a health care provider if you have new or worsening symptoms.  Take over-the-counter and prescription medicines only as told by your health care provider. Do not take aspirin, ibuprofen, or other NSAIDs unless your health care provider told you to do so.  Keep all follow-up visits as told by your health care provider. This is important. This information is not intended to replace advice given to you by your health care provider. Make sure you discuss any questions you have with your health care provider. Document Revised: 01/21/2018 Document Reviewed: 01/21/2018 Elsevier Patient Education  [employer].   I appreciate the  opportunity to care for you  Thank You  Harl Bowie , MD

## 2020-02-28 NOTE — Progress Notes (Signed)
Abigail Aguirre    [id number]    October 19, 1945  Primary Care Physician:Aguirre, Abigail Griffins, MD  Referring Physician: Harlan Stains, MD Abigail Aguirre,  [name] [address]   Chief complaint: Diarrhea, dysphagia HPI:  74 year old very pleasant female with history of psoriasis, chronic GERD and irritable bowel syndrome-diarrhea here for new patient visit for evaluation of dysphagia  Diarrhea has significantly improved since she started taking Skyrizi for psoriasis.  No longer has watery diarrhea.  She is having formed stool 1-2 bowel movements per day but does have difficulty evacuating completely.  She continues to have left lower quadrant discomfort intermittently. History of tubal ligation, partial hysterectomy and oophorectomy  She has trouble swallowing mostly liquids, has intermittent sensation of choking and also feels when she tries to eat or swallow something it gets hung up back of her throat causing her to cough or sneeze.  Feels like it is sitting in a pouch.   Followed by Dr. Michail Aguirre, A[facility]ail Aguirre [facility] previously Colonoscopy September 20, 2016: 1 tubular adenoma and 1 hyperplastic polyp was removed, recommended recall colonoscopy in 5 years EGD October 24, 2016: LA grade B reflux esophagitis, small hiatal hernia and gastritis.   Outpatient Encounter Medications as of 02/28/2020  Medication Sig  . Risankizumab-rzaa (SKYRIZI [place]) Inject into the skin every 3 (three) months.  Marland Kitchen amLODipine (NORVASC) 5 MG tablet Take 5 mg by mouth daily.  . Ascorbic Acid (VITAMIN C PO) Take 1 tablet by mouth daily.  Marland Kitchen atorvastatin (LIPITOR) 40 MG tablet Take 1 tablet (40 mg total) by mouth daily.  Marland Kitchen escitalopram (LEXAPRO) 20 MG tablet Take 20 mg by mouth daily.  Marland Kitchen Halobetasol Prop-Tazarotene (DUOBRII) 0.01-0.045 % LOTN Apply 1 application topically at bedtime. qhs to aa hands and body prn flares, avoid face, groin, axilla  . ibandronate (BONIVA) 150 MG tablet Take 150 mg  by mouth every 30 (thirty) days.  Marland Kitchen lisinopril (PRINIVIL,ZESTRIL) 20 MG tablet Take 20 mg by mouth every morning.  Marland Kitchen omeprazole (PRILOSEC) 20 MG capsule Take 20 mg by mouth daily.  . tacrolimus (PROTOPIC) 0.1 % ointment Apply topically as directed. Qd to bid to aa psoriasis in groin, under breast, under arms, and face prn flares  . [DISCONTINUED] ciprofloxacin (CIPRO) 500 MG tablet Take 1 tablet (500 mg total) by mouth 2 (two) times daily.  . [DISCONTINUED] citalopram (CELEXA) 20 MG tablet Take 20 mg by mouth daily.  . [DISCONTINUED] HYDROcodone-acetaminophen (NORCO/VICODIN) 5-325 MG tablet Take 1 tablet by mouth every 4 (four) hours as needed.  . [DISCONTINUED] ondansetron (ZOFRAN) 4 MG tablet Take 1 tablet (4 mg total) by mouth every 6 (six) hours.   No facility-administered encounter medications on file as of 02/28/2020.    Allergies as of 02/28/2020 - Review Complete 02/28/2020  Allergen Reaction Noted  . Wellbutrin [bupropion hcl] Rash 11/22/2011    Past Medical History:  Diagnosis Date  . Anxiety   . Atrial fibrillation ([place])   . Depression   . Diverticulosis   . Emphysema lung ([facility])    mild  . GERD (gastroesophageal reflux disease)   . Hiatal hernia   . HLD (hyperlipidemia)   . Hypertension   . Tubular adenoma of colon 10/24/2016    Past Surgical History:  Procedure Laterality Date  . PARTIAL HYSTERECTOMY     tubal reversal and 1 ovary removed   . TONSILLECTOMY    . TUBAL LIGATION  Family History  Problem Relation Age of Onset  . Hypertension Mother   . Dementia Mother   . Heart attack Father   . Stroke Father   . Other Father        inoperable tumor in throat  . Hearing loss Sister   . Lung cancer Paternal Grandfather   . Heart disease Paternal Grandfather   . Hashimoto's thyroiditis Son   . Hypertension Son     Social History   Socioeconomic History  . Marital status: Married    Spouse name: Not on file  . Number of children: 4  . Years of  education: Not on file  . Highest education level: Not on file  Occupational History  . Not on file  Tobacco Use  . Smoking status: Current Every Day Smoker    Packs/day: 1.00    Years: 40.00    Pack years: 40.00    Types: Cigarettes  . Smokeless tobacco: Never Used  Vaping Use  . Vaping Use: Never used  Substance and Sexual Activity  . Alcohol use: No  . Drug use: No  . Sexual activity: Not on file  Other Topics Concern  . Not on file  Social History Narrative  . Not on file   Social Determinants of Health   Financial Resource Strain:   . Difficulty of Paying Living Expenses:   Food Insecurity:   . Worried About Charity fundraiser in the Last Year:   . Arboriculturist in the Last Year:   Transportation Needs:   . Film/video editor (Medical):   Marland Kitchen Lack of Transportation (Non-Medical):   Physical Activity:   . Days of Exercise per Week:   . Minutes of Exercise per Session:   Stress:   . Feeling of Stress :   Social Connections:   . Frequency of Communication with Friends and Family:   . Frequency of Social Gatherings with Friends and Family:   . Attends Religious Services:   . Active Member of Clubs or Organizations:   . Attends Archivist Meetings:   Marland Kitchen Marital Status:   Intimate Partner Violence:   . Fear of Current or Ex-Partner:   . Emotionally Abused:   Marland Kitchen Physically Abused:   . Sexually Abused:       Review of systems: All other review of systems negative except as mentioned in the HPI.   Physical Exam: Vitals:   02/28/20 1352  BP: 124/60  Pulse: 100   Body mass index is 28.21 kg/m. Gen:      No acute distress Neuro: alert and oriented x 3 Psych: normal mood and affect  Data Reviewed:  Reviewed labs, radiology imaging, old records and pertinent past GI work up    Assessment and Plan/Recommendations:  74 year old very pleasant female with history of psoriasis, hiatal hernia, chronic GERD with complaints of intermittent  choking sensation and dysphagia  Will obtain modified barium swallow and barium esophagram to exclude oropharyngeal dysphagia, esophageal diverticula and also to assess hiatal hernia, degree of gastroesophageal reflux  We will consider EGD if has evidence of any mass lesions or esophageal stricture for esophageal dilation  Due for surveillance colonoscopy March 2023 due to history of adenomatous colon polyps  Pelvic floor dysfunction with incomplete evacuation secondary to dyssynergic defecation Refer to pelvic floor physical therapy Benefiber 1 teaspoon 3 times daily with meals   Return in 3 months   The patient was provided an opportunity to ask questions and all  were answered. The patient agreed with the plan and demonstrated an understanding of the instructions.  Damaris Hippo , MD    CC: Abigail Stains, MD

## 2020-03-03 ENCOUNTER — Other Ambulatory Visit (HOSPITAL_COMMUNITY): Payer: Self-pay | Admitting: *Deleted

## 2020-03-03 DIAGNOSIS — R1310 Dysphagia, unspecified: Secondary | ICD-10-CM

## 2020-03-07 ENCOUNTER — Telehealth: Payer: Self-pay | Admitting: *Deleted

## 2020-03-07 ENCOUNTER — Telehealth (HOSPITAL_COMMUNITY): Payer: Self-pay | Admitting: *Deleted

## 2020-03-07 NOTE — Telephone Encounter (Signed)
Patient given detailed instructions per Myocardial Perfusion Study Information Sheet for the test on 03/13/2020 at 1015. Patient notified to arrive 15 minutes early and that it is imperative to arrive on time for appointment to keep from having the test rescheduled.  If you need to cancel or reschedule your appointment, please call the office within 24 hours of your appointment. . Patient verbalized understanding.Abigail Aguirre, [place] No mychart available.

## 2020-03-07 NOTE — Telephone Encounter (Signed)
Wanted to contact patient to let her know that her Modified Barium Swallow with Speech path is scheduled at [facility] on 8/13 at 10:30am and afterwards she will have the Barium Esophogram   Patient did not answer and No voice mail could be left

## 2020-03-10 ENCOUNTER — Encounter (HOSPITAL_COMMUNITY): Payer: Medicare Other

## 2020-03-10 ENCOUNTER — Ambulatory Visit (HOSPITAL_COMMUNITY)
Admission: RE | Admit: 2020-03-10 | Discharge: 2020-03-10 | Disposition: A | Discharge status: Home / Self Care | Payer: Medicare Other | Source: Ambulatory Visit | Attending: Gastroenterology | Admitting: Gastroenterology

## 2020-03-10 ENCOUNTER — Other Ambulatory Visit: Payer: Self-pay

## 2020-03-10 DIAGNOSIS — R1310 Dysphagia, unspecified: Secondary | ICD-10-CM

## 2020-03-10 DIAGNOSIS — E785 Hyperlipidemia, unspecified: Secondary | ICD-10-CM | POA: Insufficient documentation

## 2020-03-10 DIAGNOSIS — K219 Gastro-esophageal reflux disease without esophagitis: Secondary | ICD-10-CM | POA: Diagnosis not present

## 2020-03-10 DIAGNOSIS — K449 Diaphragmatic hernia without obstruction or gangrene: Secondary | ICD-10-CM | POA: Diagnosis not present

## 2020-03-10 DIAGNOSIS — J439 Emphysema, unspecified: Secondary | ICD-10-CM | POA: Insufficient documentation

## 2020-03-10 DIAGNOSIS — I10 Essential (primary) hypertension: Secondary | ICD-10-CM | POA: Diagnosis not present

## 2020-03-10 DIAGNOSIS — Z86010 Personal history of colonic polyps: Secondary | ICD-10-CM | POA: Diagnosis not present

## 2020-03-10 DIAGNOSIS — I4891 Unspecified atrial fibrillation: Secondary | ICD-10-CM | POA: Diagnosis not present

## 2020-03-10 DIAGNOSIS — K224 Dyskinesia of esophagus: Secondary | ICD-10-CM | POA: Diagnosis not present

## 2020-03-10 NOTE — Progress Notes (Signed)
Modified Barium Swallow Progress Note  Patient Details  Name: Genesys Coggeshall MRN: [id number] Date of Birth: July 02, 1946  Today's Date: 03/10/2020  Modified Barium Swallow completed.  Full report located under Chart Review in the Imaging Section.  Brief recommendations include the following:  Clinical Impression  Pt demonstrates normal swallow function. No oral or oropharyngeal impairment idenitified.    Swallow Evaluation Recommendations       SLP Diet Recommendations: Regular solids;Thin liquid   Liquid Administration via: Cup;Straw               Postural Changes: Remain semi-upright after after feeds/meals (Comment);Seated upright at 90 degrees          Herbie Baltimore, MA CCC-SLP  Acute Rehabilitation Services Pager [phone] Office [phone]     Lynann Beaver 03/10/2020,11:17 AM

## 2020-03-13 ENCOUNTER — Ambulatory Visit (HOSPITAL_BASED_OUTPATIENT_CLINIC_OR_DEPARTMENT_OTHER): Payer: Medicare Other

## 2020-03-13 ENCOUNTER — Other Ambulatory Visit: Payer: Self-pay

## 2020-03-13 ENCOUNTER — Ambulatory Visit (HOSPITAL_COMMUNITY): Payer: Medicare Other | Attending: Internal Medicine

## 2020-03-13 VITALS — Ht <= 58 in | Wt 131.0 lb

## 2020-03-13 DIAGNOSIS — R110 Nausea: Secondary | ICD-10-CM | POA: Insufficient documentation

## 2020-03-13 DIAGNOSIS — R072 Precordial pain: Secondary | ICD-10-CM

## 2020-03-13 LAB — MYOCARDIAL PERFUSION IMAGING
LV dias vol: 44 mL (ref 46–106)
LV sys vol: 15 mL
Peak HR: 96 {beats}/min
Rest HR: 73 {beats}/min
SDS: 3
SRS: 2
SSS: 5
TID: 0.98

## 2020-03-13 LAB — ECHOCARDIOGRAM COMPLETE
Area-P 1/2: 2.46 cm2
Height: 58 in
S' Lateral: 1.9 cm
Weight: 2096 oz

## 2020-03-13 MED ORDER — TECHNETIUM TC 99M TETROFOSMIN IV KIT
30.4000 | PACK | Freq: Once | INTRAVENOUS | Status: AC | PRN
  Administered 2020-03-13: 30.4 via INTRAVENOUS
  Filled 2020-03-13: qty 31

## 2020-03-13 MED ORDER — TECHNETIUM TC 99M TETROFOSMIN IV KIT
11.0000 | PACK | Freq: Once | INTRAVENOUS | Status: AC | PRN
  Administered 2020-03-13: 11 via INTRAVENOUS
  Filled 2020-03-13: qty 11

## 2020-03-13 MED ORDER — AMINOPHYLLINE 25 MG/ML IV SOLN
150.0000 mg | Freq: Once | INTRAVENOUS | Status: AC
  Administered 2020-03-13: 150 mg via INTRAVENOUS

## 2020-03-13 MED ORDER — REGADENOSON 0.4 MG/5ML IV SOLN
0.4000 mg | Freq: Once | INTRAVENOUS | Status: AC
  Administered 2020-03-13: 0.4 mg via INTRAVENOUS

## 2020-03-29 ENCOUNTER — Other Ambulatory Visit: Payer: Self-pay

## 2020-03-29 DIAGNOSIS — L409 Psoriasis, unspecified: Secondary | ICD-10-CM

## 2020-03-29 MED ORDER — SKYRIZI 150 MG/ML ~~LOC~~ SOSY: 150.0000 mg | PREFILLED_SYRINGE | SUBCUTANEOUS | 0 refills | Status: DC

## 2020-03-29 NOTE — Progress Notes (Signed)
New dosage available.

## 2020-04-03 ENCOUNTER — Other Ambulatory Visit: Payer: Self-pay | Admitting: Gastroenterology

## 2020-04-05 ENCOUNTER — Other Ambulatory Visit: Payer: Self-pay

## 2020-04-05 DIAGNOSIS — L409 Psoriasis, unspecified: Secondary | ICD-10-CM

## 2020-04-05 MED ORDER — SKYRIZI 150 MG/ML ~~LOC~~ SOSY
150.0000 mg | PREFILLED_SYRINGE | SUBCUTANEOUS | 0 refills | Status: DC
Start: 2020-04-05 — End: 2020-09-12

## 2020-04-05 NOTE — Progress Notes (Signed)
Pharmacy change

## 2020-04-06 ENCOUNTER — Ambulatory Visit: Payer: Medicare Other | Admitting: Dermatology

## 2020-04-17 ENCOUNTER — Telehealth: Payer: Self-pay

## 2020-04-17 NOTE — Telephone Encounter (Signed)
Patient called and left msg over the weekend that she is having a hard time getting her Skyrizi shipped. She has requested for this to be shipped Oct 5th but Dina Rich states they cant ship until the speak with [place] - per the patient. I called [employer] today and sat on hole for 20 minutes with no response. I also called patient and left message to find out who or what number she is calling.

## 2020-04-17 NOTE — Telephone Encounter (Signed)
Spoke with patient this afternoon. She has given me the phone number for her nurse Ambassador [phone]). I have left a msg at this number for someone to return my call.

## 2020-04-18 ENCOUNTER — Other Ambulatory Visit: Payer: Self-pay

## 2020-04-18 MED ORDER — SKYRIZI PEN 150 MG/ML ~~LOC~~ SOAJ: 1.0000 "pen " | SUBCUTANEOUS | 2 refills | Status: DC

## 2020-04-18 NOTE — Progress Notes (Signed)
Skyrizi 150mg  (1) needed for RX.

## 2020-04-26 ENCOUNTER — Ambulatory Visit: Payer: Medicare Other | Admitting: Dermatology

## 2020-04-28 ENCOUNTER — Other Ambulatory Visit: Payer: Self-pay | Admitting: Gastroenterology

## 2020-05-03 ENCOUNTER — Other Ambulatory Visit: Payer: Self-pay

## 2020-05-03 ENCOUNTER — Encounter: Payer: Self-pay | Admitting: Dermatology

## 2020-05-03 ENCOUNTER — Ambulatory Visit: Payer: Medicare Other | Admitting: Dermatology

## 2020-05-03 DIAGNOSIS — L409 Psoriasis, unspecified: Secondary | ICD-10-CM

## 2020-05-03 MED ORDER — RISANKIZUMAB-RZAA 150 MG/ML ~~LOC~~ SOAJ
150.0000 mg | Freq: Once | SUBCUTANEOUS | Status: AC
  Administered 2020-05-03: 150 mg via SUBCUTANEOUS

## 2020-05-03 NOTE — Patient Instructions (Addendum)
Recommend daily broad spectrum sunscreen SPF 30+ to sun-exposed areas, reapply every 2 hours as needed. Call for new or changing lesions.   Side effects of Otezla (apremilast) include diarrhea, nausea, headache, upper respiratory infection, depression, and weight decrease (5-10%). It should only be taken by pregnant women after a discussion regarding risks and benefits with their doctor. Goal is control of skin condition, not cure.  The use of Otezla requires long term medication management, including periodic office visits.  

## 2020-05-03 NOTE — Progress Notes (Signed)
   Follow-Up Visit   Subjective  Abigail Aguirre is a 74 y.o. female who presents for the following: Psoriasis.  Patient presents today for follow up on psoriasis, has been using Duobrii, Protopic, and a foam that we have prescribed as well as [employer].   The following portions of the chart were reviewed this encounter and updated as appropriate:  Tobacco  Allergies  Meds  Problems  Med Hx  Surg Hx  Fam Hx      Review of Systems:  No other skin or systemic complaints except as noted in HPI or Assessment and Plan.  Objective  Well appearing patient in no apparent distress; mood and affect are within normal limits.  A focused examination was performed including hands, under breasts, ears, between breast, back, lower abdomen, scalp and ears. Relevant physical exam findings are noted in the Assessment and Plan.  Objective  Right hand, between breast, under breast, low abdomen, Behind ears,: Right palm with thick scaly pink plaque, scaly pink plaque postauricular, low abdomen with macerated pink plaques   Assessment & Plan  Psoriasis Right hand, between breast, under breast, low abdomen, Behind ears,  Chronic, poor response to therapy.  Not at goal.    Reviewed chronic nature. Area behind ear looking a little better, low abdomen and palm not as much improvement as expected on Skyrizi. Palm very symptomatic.  Will schedule for patch testing Has tried [employer], Cosentyx, and Psychologist, prison and probation services.   Continue [employer] take as directed and follow numbers on sample pack, will titrate up to 30mg  daily and recheck 1 month If [place] not working will consider Stelara or possibly TNF-alpha inhibitor in future  Other Related Medications Halobetasol Prop-Tazarotene (DUOBRII) 0.01-0.045 % LOTN tacrolimus (PROTOPIC) 0.1 % ointment Risankizumab-rzaa (SKYRIZI) 150 MG/ML SOSY  Return in 1 month (on 06/03/2020) for Psoriasis.   IDonzetta Kohut, CMA, am acting as scribe for Forest Gleason, MD .  Documentation: I have reviewed the above documentation for accuracy and completeness, and I agree with the above.  Forest Gleason, MD

## 2020-05-10 ENCOUNTER — Encounter: Payer: Self-pay | Admitting: Physical Therapy

## 2020-05-10 ENCOUNTER — Other Ambulatory Visit: Payer: Self-pay

## 2020-05-10 ENCOUNTER — Ambulatory Visit: Payer: Medicare Other | Attending: Gastroenterology | Admitting: Physical Therapy

## 2020-05-10 DIAGNOSIS — M6281 Muscle weakness (generalized): Secondary | ICD-10-CM | POA: Diagnosis present

## 2020-05-10 DIAGNOSIS — R278 Other lack of coordination: Secondary | ICD-10-CM | POA: Diagnosis present

## 2020-05-10 NOTE — Therapy (Addendum)
[facility] [address] [place], [place], [address] Phone: [phone]   Fax:  [phone]  Physical Therapy Treatment  Patient Details  Name: Abigail Aguirre MRN: [id number] Date of Birth: [birth year] Referring Provider (PT): Dr. Harl Bowie   Encounter Date: 05/10/2020   PT End of Session - 05/10/20 1439    Visit Number 1    Date for PT Re-Evaluation 07/19/20    Authorization Type U[employer]    Authorization - Visit Number 1    Authorization - Number of Visits 10    PT Start Time 5364   got lost   PT Stop Time 1145    PT Time Calculation (min) 20 min    Activity Tolerance Patient tolerated treatment well    Behavior During Therapy [facility] for tasks assessed/performed           Past Medical History:  Diagnosis Date  . Anxiety   . Atrial fibrillation ([place])   . Depression   . Diverticulosis   . Emphysema lung ([facility])    mild  . GERD (gastroesophageal reflux disease)   . Hiatal hernia   . HLD (hyperlipidemia)   . Hypertension   . Tubular adenoma of colon 10/24/2016    Past Surgical History:  Procedure Laterality Date  . PARTIAL HYSTERECTOMY     tubal reversal and 1 ovary removed   . TONSILLECTOMY    . TUBAL LIGATION      There were no vitals filed for this visit.   Subjective Assessment - 05/10/20 1121    Subjective urgency to go to the bathroom and unable to hold it back. Sometimes goes to the bathroom 2 times per day.    Patient Stated Goals reduce weting her pants, reduce extreme urgency    Currently in Pain? Yes    Pain Score 4     Pain Location Abdomen    Pain Orientation Lower    Pain Descriptors / Indicators Cramping    Pain Type Chronic pain    Pain Onset More than a month ago    Pain Frequency Intermittent    Aggravating Factors  prior to a bowel movement or just cramps on her own, bloating    Pain Relieving Factors nothing to help              [facility] PT Assessment -  05/10/20 0001      Assessment   Medical Diagnosis K59.02 Constipation, outlet Dysfunction    Referring Provider (PT) Dr. Karleen Hampshire Nandigam    Onset Date/Surgical Date --   chronic   Prior Therapy none      Precautions   Precautions None      Restrictions   Weight Bearing Restrictions No      Balance Screen   Has the patient fallen in the past 6 months No    Has the patient had a decrease in activity level because of a fear of falling?  No    Is the patient reluctant to leave their home because of a fear of falling?  No      Home Ecologist residence      Prior Function   Level of Independence Independent      Cognition   Overall Cognitive Status Within Functional Limits for tasks assessed      Posture/Postural Control   Posture/Postural Control Postural limitations    Posture Comments Patient has scoliosis      ROM / Strength  AROM / PROM / Strength AROM;PROM;Strength      Strength   Right Hip Extension 4/5    Right Hip ABduction 4-/5    Left Hip Extension 4/5    Left Hip ABduction 4/5      Palpation   Palpation comment left lower quadrant and suprapubic area                      Pelvic Floor Special Questions - 05/10/20 0001    Urinary Leakage Yes    Pad use 1 pantyliner    Activities that cause leaking With strong urge;Walking;Other    Other activities that cause leaking running water    Fecal incontinence No   fluctuate from diarrhea and firm stool   Falling out feeling (prolapse) No    Exam Type Deferred   Patient was not comfortable for internal assessment                    PT Education - 05/10/20 1437    Education Details see if she is able to stop the flow of urine one time to see about strength    Person(s) Educated Patient    Methods Explanation    Comprehension Verbalized understanding            PT Short Term Goals - 05/10/20 1541      PT SHORT TERM GOAL #1   Title independent with  initial HEP with pelvic floor contraction    Time 4    Period Weeks    Status New    Target Date 06/07/20      PT SHORT TERM GOAL #2   Title understand what bladder irritants are and how they affect the bladder    Time 4    Period Weeks    Status New    Target Date 06/07/20             PT Long Term Goals - 05/10/20 1542      PT LONG TERM GOAL #1   Title independent with advanced HEP including elongation of the pelvic floor, pelvic floor contractions, and core stability    Time 1    Period Weeks    Status New    Target Date 07/19/20      PT LONG TERM GOAL #2   Title urinary leakage when she has the urge decreased >/= 50% due to increased pelvic floor strength    Time 10    Period Weeks    Status New    Target Date 07/19/20      PT LONG TERM GOAL #3   Title able to delay the urge to urinate when she hears running water due to understanding how to perform behavorial modifications    Time 10    Period Weeks    Status New    Target Date 07/19/20      PT LONG TERM GOAL #4   Title able to evacuate her stool fully due to improve pelvic floor coordination and toileting technique    Time 10    Period Weeks    Status New    Target Date 07/19/20                 Plan - 05/10/20 1440    Clinical Impression Statement Patient is a 74 year old female with urinary leakage and difficulty with evacuating stool for several years. Patient will leak urine with strong urge, and walking, running water. She will  sit on the commode to have a bowel movement for 15-30 minutes. Patient wears 1 pantyliner per day due to leakage. Palpable tenderness located in left lower quadrant and suprapubic area. Single leg stance is 3 seconds and patient feels she is leaning to one side when walking. Weakness in bilateral hip abduction and adduction. Patient will benefit from skilled therapy to improve pelvic floor strength and coordination to reduce leakage and straining to have a bowel movement.     Personal Factors and Comorbidities Comorbidity 3+    Comorbidities Partial hysterectomy; Atrial fibrillation; Diverticulosis    Examination-Activity Limitations Continence;Toileting;Locomotion Level    Examination-Participation Restrictions Community Activity    Stability/Clinical Decision Making Stable/Uncomplicated    Clinical Decision Making Low    Rehab Potential Good    PT Frequency 1x / week    PT Duration Other (comment)   10 weeks   PT Treatment/Interventions Biofeedback;Cryotherapy;Electrical Stimulation;Therapeutic activities;Therapeutic exercise;Neuromuscular re-education;Manual techniques;Patient/family education    PT Next Visit Plan see if she is able to stop flow of urine; pelvic floor contraction; balance ex; abodminal contraction; toilting technique    Consulted and Agree with Plan of Care Patient           Patient will benefit from skilled therapeutic intervention in order to improve the following deficits and impairments:  Decreased coordination, Decreased activity tolerance, Decreased endurance, Decreased strength  Visit Diagnosis: Muscle weakness (generalized) - Plan: PT plan of care cert/re-cert  Other lack of coordination - Plan: PT plan of care cert/re-cert     Problem List There are no problems to display for this patient.   Earlie Counts, PT 05/10/20 3:47 PM   [facility] [address], [place], [place], [address] Phone: [phone]   Fax:  [phone]  Name: Abigail Aguirre MRN: [id number] Date of Birth: Sep 25, 1945  PHYSICAL THERAPY DISCHARGE SUMMARY  Visits from Start of Care: 1  Current functional level related to goals / functional outcomes: See above. Patient only attended the initial evaluation. She canceled the rest of her visits and has not rescheduled.    Remaining deficits: Unable to reassess patient due to her not coming back for therapy.    Education /  Equipment: HEP Plan:                                                    Patient goals were not met. Patient is being discharged due to not returning since the last visit.  Thank you for the referral. Earlie Counts, PT 07/18/20 8:50 AM  ?????

## 2020-05-12 ENCOUNTER — Other Ambulatory Visit: Payer: Self-pay | Admitting: Gastroenterology

## 2020-05-17 ENCOUNTER — Ambulatory Visit: Payer: Medicare Other | Admitting: Physical Therapy

## 2020-05-19 ENCOUNTER — Telehealth (HOSPITAL_COMMUNITY): Payer: Self-pay | Admitting: Emergency Medicine

## 2020-05-19 ENCOUNTER — Ambulatory Visit (HOSPITAL_COMMUNITY)
Admission: RE | Admit: 2020-05-19 | Discharge: 2020-05-19 | Disposition: A | Discharge status: Home / Self Care | Payer: Medicare Other | Source: Ambulatory Visit | Attending: Pulmonary Disease | Admitting: Pulmonary Disease

## 2020-05-19 ENCOUNTER — Other Ambulatory Visit (HOSPITAL_COMMUNITY): Payer: Self-pay

## 2020-05-19 ENCOUNTER — Other Ambulatory Visit (HOSPITAL_COMMUNITY): Payer: Self-pay | Admitting: Emergency Medicine

## 2020-05-19 ENCOUNTER — Other Ambulatory Visit: Payer: Self-pay | Admitting: Infectious Diseases

## 2020-05-19 DIAGNOSIS — U071 COVID-19: Secondary | ICD-10-CM | POA: Insufficient documentation

## 2020-05-19 DIAGNOSIS — Z23 Encounter for immunization: Secondary | ICD-10-CM | POA: Diagnosis not present

## 2020-05-19 DIAGNOSIS — R54 Age-related physical debility: Secondary | ICD-10-CM

## 2020-05-19 MED ORDER — SODIUM CHLORIDE 0.9 % IV SOLN: INTRAVENOUS | Status: DC | PRN

## 2020-05-19 MED ORDER — FAMOTIDINE IN NACL 20-0.9 MG/50ML-% IV SOLN: 20.0000 mg | Freq: Once | INTRAVENOUS | Status: DC | PRN

## 2020-05-19 MED ORDER — SODIUM CHLORIDE 0.9 % IV SOLN: Freq: Once | INTRAVENOUS | Status: AC

## 2020-05-19 MED ORDER — METHYLPREDNISOLONE SODIUM SUCC 125 MG IJ SOLR: 125.0000 mg | Freq: Once | INTRAMUSCULAR | Status: DC | PRN

## 2020-05-19 MED ORDER — DIPHENHYDRAMINE HCL 50 MG/ML IJ SOLN: 50.0000 mg | Freq: Once | INTRAMUSCULAR | Status: DC | PRN

## 2020-05-19 MED ORDER — ALBUTEROL SULFATE HFA 108 (90 BASE) MCG/ACT IN AERS: 2.0000 | INHALATION_SPRAY | Freq: Once | RESPIRATORY_TRACT | Status: DC | PRN

## 2020-05-19 MED ORDER — ONDANSETRON HCL 4 MG/2ML IJ SOLN
4.0000 mg | Freq: Once | INTRAMUSCULAR | Status: AC
  Administered 2020-05-19: 4 mg via INTRAVENOUS
  Filled 2020-05-19: qty 2

## 2020-05-19 MED ORDER — EPINEPHRINE 0.3 MG/0.3ML IJ SOAJ: 0.3000 mg | Freq: Once | INTRAMUSCULAR | Status: DC | PRN

## 2020-05-19 MED ORDER — LOPERAMIDE HCL 2 MG PO CAPS
4.0000 mg | ORAL_CAPSULE | ORAL | Status: AC | PRN
  Administered 2020-05-19: 4 mg via ORAL
  Filled 2020-05-19: qty 2

## 2020-05-19 NOTE — Discharge Instructions (Signed)

## 2020-05-19 NOTE — Progress Notes (Signed)
I connected by phone with Abigail Aguirre on 05/19/2020 at 2:05 PM to discuss the potential use of a new treatment for mild to moderate COVID-19 viral infection in non-hospitalized patients.  This patient is a 74 y.o. female that meets the FDA criteria for Emergency Use Authorization of COVID monoclonal antibody casirivimab/imdevimab or bamlanivimab/eteseviamb.  Has a (+) direct SARS-CoV-2 viral test result  Has mild or moderate COVID-19   Is NOT hospitalized due to COVID-19  Is within 10 days of symptom onset  Has at least one of the high risk factor(s) for progression to severe COVID-19 and/or hospitalization as defined in EUA.  Specific high risk criteria : Older age (>/= 75 yo)   I have spoken and communicated the following to the patient or parent/caregiver regarding COVID monoclonal antibody treatment:  1. FDA has authorized the emergency use for the treatment of mild to moderate COVID-19 in adults and pediatric patients with positive results of direct SARS-CoV-2 viral testing who are 36 years of age and older weighing at least 40 kg, and who are at high risk for progressing to severe COVID-19 and/or hospitalization.  2. The significant known and potential risks and benefits of COVID monoclonal antibody, and the extent to which such potential risks and benefits are unknown.  3. Information on available alternative treatments and the risks and benefits of those alternatives, including clinical trials.  4. Patients treated with COVID monoclonal antibody should continue to self-isolate and use infection control measures (e.g., wear mask, isolate, social distance, avoid sharing personal items, clean and disinfect "high touch" surfaces, and frequent handwashing) according to CDC guidelines.   5. The patient or parent/caregiver has the option to accept or refuse COVID monoclonal antibody treatment.  After reviewing this information with the patient, the patient has agreed to  receive one of the available covid 19 monoclonal antibodies and will be provided an appropriate fact sheet prior to infusion. Abigail Madeira, NP 05/19/2020 2:05 PM

## 2020-05-19 NOTE — Progress Notes (Signed)
°  Diagnosis: COVID-19  Physician: Dr. Joya Gaskins  Procedure: Covid Infusion Clinic Med: bamlanivimab\etesevimab infusion - Provided patient with bamlanimivab\etesevimab fact sheet for patients, parents and caregivers prior to infusion.  Complications: No immediate complications noted.  Discharge: Discharged home   Abigail Aguirre 05/19/2020

## 2020-05-19 NOTE — Telephone Encounter (Signed)
Called to Discuss with patient about Covid symptoms and the use of the monoclonal antibody infusion for those with mild to moderate Covid symptoms and at a high risk of hospitalization.     Pt appears to qualify for this infusion due to co-morbid conditions and/or a member of an at-risk group in accordance with the FDA Emergency Use Authorization.    She started having symptoms this week. Multiple risk factors.

## 2020-05-19 NOTE — Telephone Encounter (Signed)
Called pt and explained possible monoclonal antibody treatment. Sx started 10/19. Tested positive 10/21 with a home test. Sx include cough, chest pain, vomiting, headache, weakness, fatigue, and body aches. Qualifying risk factors emphysema, HTN, and 16 year smoking history. Pt interested in tx. Informed pt an APP will call back to possibly schedule an appointment.

## 2020-05-23 ENCOUNTER — Encounter: Payer: Self-pay | Admitting: Dermatology

## 2020-05-23 MED ORDER — OTEZLA 30 MG PO TABS: 30.0000 mg | ORAL_TABLET | Freq: Two times a day (BID) | ORAL | 2 refills | Status: DC

## 2020-05-23 NOTE — Addendum Note (Signed)
Addended by: Alfonso Patten on: 05/23/2020 02:35 PM   Modules accepted: Orders

## 2020-05-24 ENCOUNTER — Encounter: Payer: Medicare Other | Admitting: Physical Therapy

## 2020-05-30 ENCOUNTER — Other Ambulatory Visit: Payer: Self-pay

## 2020-05-30 MED ORDER — OTEZLA 30 MG PO TABS
30.0000 mg | ORAL_TABLET | Freq: Every day | ORAL | 0 refills | Status: DC
Start: 2020-05-30 — End: 2020-12-07

## 2020-05-30 NOTE — Progress Notes (Signed)
RX corrected to match office note per Iantha Fallen

## 2020-05-31 ENCOUNTER — Encounter: Payer: Medicare Other | Admitting: Physical Therapy

## 2020-06-05 ENCOUNTER — Ambulatory Visit: Payer: Medicare Other

## 2020-06-07 ENCOUNTER — Ambulatory Visit: Payer: Medicare Other

## 2020-06-19 ENCOUNTER — Telehealth: Payer: Self-pay

## 2020-06-19 NOTE — Telephone Encounter (Signed)
Received a fax from [facility] stating that they had called Abigail Aguirre about her [place] and she told them that she wanted to consult with Dr. Laurence Ferrari before proceeding with the prescription.

## 2020-06-21 ENCOUNTER — Encounter: Payer: Medicare Other | Admitting: Physical Therapy

## 2020-06-21 NOTE — Telephone Encounter (Signed)
Spoke with patient. She has just recovered from [place]. Recommend continuing with next dose of Skyrizi in late December.   Recommend restarting otezla. She stopped when she was taking a lot of supplements when she had COVID. She will come by the office next week to pick up a starter pack of Otezla to restart at the lower dose.   MAs please reschedule for follow-up in early January (instead of next week). We can do Skyrizi injection and follow-up then. Please put Otezla starter pack at front desk for Abigail Aguirre to pick up on Tuesday. Thank you

## 2020-06-28 ENCOUNTER — Encounter: Payer: Medicare Other | Admitting: Physical Therapy

## 2020-07-11 ENCOUNTER — Ambulatory Visit: Payer: Medicare Other | Admitting: Dermatology

## 2020-08-03 ENCOUNTER — Ambulatory Visit: Payer: Medicare Other | Admitting: Dermatology

## 2020-08-03 ENCOUNTER — Other Ambulatory Visit: Payer: Self-pay

## 2020-08-03 DIAGNOSIS — L280 Lichen simplex chronicus: Secondary | ICD-10-CM | POA: Diagnosis not present

## 2020-08-03 DIAGNOSIS — L409 Psoriasis, unspecified: Secondary | ICD-10-CM

## 2020-08-03 NOTE — Progress Notes (Signed)
   Follow-Up Visit   Subjective  Abigail Aguirre is a 75 y.o. female who presents for the following: Psoriasis (Patient is here for follow up she states area on right hand are still dry and scaly. She has been using otezla 30 mg tablet Halobetasol ointment, tacrolimus 0.1 % ointment and Skyrizi 150mg  / ml soxy. She states areas are still unresolved. ).  The following portions of the chart were reviewed this encounter and updated as appropriate:  Tobacco  Allergies  Meds  Problems  Med Hx  Surg Hx  Fam Hx        Objective  Well appearing patient in no apparent distress; mood and affect are within normal limits.  A focused examination was performed including back,bilateral hands . Relevant physical exam findings are noted in the Assessment and Plan.  Objective  right hand, between breast, under breast, lower abdomen, behind ears: Small scaly pink plaque post auricular  Scaly pink plaque with fissures at right hand Small scaly pink plaque at left hand  Lower abdomen with slightly erythematous patch  Scaly pink plaque mid chest       Objective  Mid Back: Lichenfied plaques   Assessment & Plan  Psoriasis right hand, between breast, under breast, lower abdomen, behind ears  Chronic condition with duration over one year. Condition is bothersome to patient. Currently flared.  Increase Otezla to 30 mg twice daily Otezla take as directed and follow numbers on sample pack, will titrate up to 30mg  daily and recheck 1 month Continue Skyrizi If + not working will [place] or possibly TNF-alpha inhibitor in future  Continue using Halobetasol Prop-Tazarotene (DUOBRII) 0.01-0.045 % LOTN use once a daily  Continue using tacrolimus (PROTOPIC) 0.1 % ointment   Other Related Medications Halobetasol Prop-Tazarotene (DUOBRII) 0.01-0.045 % LOTN tacrolimus (PROTOPIC) 0.1 % ointment Risankizumab-rzaa (SKYRIZI) 150 MG/ML SOSY  Lichen simplex  chronicus Mid Back  Discussed ILK deferred today   Return in about 4 weeks (around 08/31/2020) for Patch testing and 1 month follow up for psoriasis, .  I, [id number], CMA, am acting as scribe for 10/29/2020, MD.  Documentation: I have reviewed the above documentation for accuracy and completeness, and I agree with the above.  Asher Muir, MD

## 2020-08-03 NOTE — Patient Instructions (Addendum)
Please bring medications with you to next appointment.   Increase Otezla 30 mg to twice daily  For open areas at hands, apply Mupirocin ointment OR Vaseline three times per day.  For stubborn areas on hands, apply Duobrii in the morning and Eucrisa at night. Can cover with cotton gloves at night if tolerated to help keep medicine in place  For areas on back, apply Duobrii once a day and avoid scratching or picking. OK to cover.   Continue Skyrizi  Always wear gloves for washing dishes or handling cleaning products. Minimize hand washing to when needed. Otherwise use hand sanitizer for sanitizing in between washes.    Topical steroids (such as triamcinolone, fluocinolone, fluocinonide, mometasone, clobetasol, halobetasol, betamethasone, hydrocortisone) can cause thinning and lightening of the skin if they are used for too long in the same area. Your physician has selected the right strength medicine for your problem and area affected on the body. Please use your medication only as directed by your physician to prevent side effects.

## 2020-08-22 ENCOUNTER — Encounter: Payer: Self-pay | Admitting: Dermatology

## 2020-09-05 ENCOUNTER — Ambulatory Visit: Payer: Medicare Other

## 2020-09-05 ENCOUNTER — Other Ambulatory Visit: Payer: Self-pay

## 2020-09-05 DIAGNOSIS — L239 Allergic contact dermatitis, unspecified cause: Secondary | ICD-10-CM | POA: Diagnosis not present

## 2020-09-05 NOTE — Progress Notes (Signed)
Pt here for placement of patch testing.   T.R.U.E test x 36 placed on pts back. Pt advised to keep back as dry as possible until follow up when patches are removed.

## 2020-09-07 ENCOUNTER — Other Ambulatory Visit: Payer: Self-pay

## 2020-09-07 ENCOUNTER — Ambulatory Visit: Payer: Medicare Other

## 2020-09-07 DIAGNOSIS — L209 Atopic dermatitis, unspecified: Secondary | ICD-10-CM

## 2020-09-07 NOTE — Progress Notes (Signed)
Patient here for day 3 for True Test X36 reading.   Panels removed from patients back and patient advised how to read patch testing Friday through Monday. Patient advised do not soak and do not scrub area.   Patient showed questionable reaction to site #32.

## 2020-09-12 ENCOUNTER — Ambulatory Visit: Payer: Medicare Other | Admitting: Dermatology

## 2020-09-12 ENCOUNTER — Other Ambulatory Visit: Payer: Self-pay

## 2020-09-12 ENCOUNTER — Encounter: Payer: Self-pay | Admitting: Dermatology

## 2020-09-12 DIAGNOSIS — L409 Psoriasis, unspecified: Secondary | ICD-10-CM | POA: Diagnosis not present

## 2020-09-12 DIAGNOSIS — L2389 Allergic contact dermatitis due to other agents: Secondary | ICD-10-CM | POA: Diagnosis not present

## 2020-09-12 NOTE — Patient Instructions (Addendum)
For rash under the breast and low abdomen use Calcipotriene cream followed by Tacrolimus ointment   For hands for weekends use Enstilar foam, on the week day use Calcipotriene cream twice a day, Use Eucrisa ointment once a day, use Tacrolimus ointment once a day   Use Ketoconazole cream  for rash under the breast, hands and abdomen daily as needed

## 2020-09-12 NOTE — Progress Notes (Unsigned)
   Follow-Up Visit   Subjective  Abigail Aguirre is a 75 y.o. female who presents for the following: Follow-up (Pt here for her final patch test reading, questionable to #32 ) and Psoriasis (F/u on Psoriasis pt taking Skyrizi with a poor response, she still itchy and in pain from psoriasis, ).  The following portions of the chart were reviewed this encounter and updated as appropriate:   Tobacco  Allergies  Meds  Problems  Med Hx  Surg Hx  Fam Hx      Review of Systems:  No other skin or systemic complaints except as noted in HPI or Assessment and Plan.  Objective  Well appearing patient in no apparent distress; mood and affect are within normal limits.  A focused examination was performed including hands, face, ears, scalp. Relevant physical exam findings are noted in the Assessment and Plan.  Objective  hands, post auricular: Scaly pink plaques  Objective  post auricular hands: Scaly pink plaques    Assessment & Plan  Allergic contact dermatitis due to other agents hands, post auricular  Questionable reaction to #36 Mercaptobenzothiazole   Given focal dermatitis at hands, concerning for possible exposure with gloves. Recommend avoiding rubber containing gloves.  Can use tacrolimus daily as needed.   Information sheet given       Psoriasis post auricular hands  Chronic condition with duration over one year. Condition is significantly bothersome to patient. Currently flared despite therapy with skyrizi. Did not tolerate otezla due to GI SEs.  Psoriasis - severe on systemic "biologic" treatment injections.  Psoriasis is a chronic non-curable, but treatable genetic/hereditary disease that may have other systemic features affecting other organ systems such as joints (Psoriatic Arthritis).  It is linked with heart disease, inflammatory bowel disease, non-alcoholic fatty liver disease, and depression. Significant skin psoriasis and/or psoriatic arthritis may  have significant symptoms and affects activities of daily activity and often benefits from systemic "biologic" injection treatments.  These "biologic" treatments have some potential side effects including immunosuppression and require pre-treatment laboratory screening and periodic laboratory monitoring and periodic in person evaluation and monitoring by the attending dermatologist physician.  D/C [place]  Plan on D/C Skyrizi and starting Stelara injection. Since she just had skyrizi injection, will have to wait to start Stelara until [place] out of system. At follow-up, will order quantiferon gold and submit for Stelara  For rash under the breast and low abdomen use Calcipotriene cream followed by Tacrolimus ointment daily to twice a day  For hands for weekends use Enstilar foam, on the weekdays use Calcipotriene cream twice a day, Use Eucrisa ointment once a day, use Tacrolimus ointment once a day   Use Ketoconazole cream  for rash under the breast, hands and abdomen daily as needed (pt thinks this helps with all areas of rash)  Keep visit with primary care doctor, GYN doctors, yearly cancer screenings   Other Related Medications Halobetasol Prop-Tazarotene (DUOBRII) 0.01-0.045 % LOTN tacrolimus (PROTOPIC) 0.1 % ointment  Return in about 2 months (around 11/10/2020) for Psoriasis .  I, Marye Round, CMA, am acting as scribe for Forest Gleason, MD .  Documentation: I have reviewed the above documentation for accuracy and completeness, and I agree with the above.  Forest Gleason, MD

## 2020-09-13 ENCOUNTER — Telehealth: Payer: Self-pay | Admitting: Dermatology

## 2020-09-13 ENCOUNTER — Encounter: Payer: Self-pay | Admitting: Dermatology

## 2020-09-18 NOTE — Addendum Note (Signed)
Addended by: Alfonso Patten on: 09/18/2020 07:22 PM   Modules accepted: Level of Service

## 2020-10-09 DIAGNOSIS — I10 Essential (primary) hypertension: Secondary | ICD-10-CM | POA: Diagnosis not present

## 2020-10-09 DIAGNOSIS — E785 Hyperlipidemia, unspecified: Secondary | ICD-10-CM | POA: Diagnosis not present

## 2020-10-09 DIAGNOSIS — F17200 Nicotine dependence, unspecified, uncomplicated: Secondary | ICD-10-CM | POA: Diagnosis not present

## 2020-10-09 DIAGNOSIS — M810 Age-related osteoporosis without current pathological fracture: Secondary | ICD-10-CM | POA: Diagnosis not present

## 2020-10-09 DIAGNOSIS — I700 Atherosclerosis of aorta: Secondary | ICD-10-CM | POA: Diagnosis not present

## 2020-11-14 ENCOUNTER — Encounter: Payer: Self-pay | Admitting: Dermatology

## 2020-11-14 ENCOUNTER — Ambulatory Visit: Payer: Medicare Other | Admitting: Dermatology

## 2020-11-14 ENCOUNTER — Other Ambulatory Visit: Payer: Self-pay

## 2020-11-14 DIAGNOSIS — L409 Psoriasis, unspecified: Secondary | ICD-10-CM

## 2020-11-14 DIAGNOSIS — L408 Other psoriasis: Secondary | ICD-10-CM

## 2020-11-14 MED ORDER — KETOCONAZOLE 2 % EX CREA: TOPICAL_CREAM | CUTANEOUS | 2 refills | Status: DC

## 2020-11-14 MED ORDER — TACROLIMUS 0.1 % EX OINT: TOPICAL_OINTMENT | Freq: Two times a day (BID) | CUTANEOUS | 0 refills | Status: DC

## 2020-11-14 NOTE — Patient Instructions (Addendum)
Reviewed risks of biologics including immunosuppression, infections, injection site reaction, and failure to improve condition. Goal is control of skin condition, not cure.  Some older biologics such as Humira and Enbrel may slightly increase risk of malignancy and may worsen congestive heart failure. The use of biologics requires long term medication management, including periodic office visits and monitoring of blood work.   

## 2020-11-14 NOTE — Progress Notes (Signed)
   Follow-Up Visit   Subjective  Abigail Aguirre is a 75 y.o. female who presents for the following: Follow-up (2 months f/u Psoriasis on hands, abdomen, treating with Enstilar foam and otc working hands with a poor response, past treatment Skyrizi no help, last Skyrizi injection was 08-03-2020). Did not tolerate Otezla.   The following portions of the chart were reviewed this encounter and updated as appropriate:   Tobacco  Allergies  Meds  Problems  Med Hx  Surg Hx  Fam Hx      Review of Systems:  No other skin or systemic complaints except as noted in HPI or Assessment and Plan.  Objective  Well appearing patient in no apparent distress; mood and affect are within normal limits.  A focused examination was performed including face,scalp, hands,abdomen . Relevant physical exam findings are noted in the Assessment and Plan.  Objective  Right Hand, left hand, abdomen fold: Right palm with large fissured scaly pink plaque Macerated scaly pink well demarcated plaque across low abdomen BSA 6% despite therapy with skyrizi, significantly symptomatic   Assessment & Plan  Psoriasis Right Hand, left hand, abdomen fold  Psoriasis with Psoriasis Inversa  Chronic condition with duration or expected duration over one year. Condition is bothersome to patient. Currently flared. BSA 6% despite therapy with Skyrizi. Unable to tolerate addition of otezla significantly symptomatic  Psoriasis - severe on systemic "biologic" treatment injections.  Psoriasis is a chronic non-curable, but treatable genetic/hereditary disease that may have other systemic features affecting other organ systems such as joints (Psoriatic Arthritis).  It is linked with heart disease, inflammatory bowel disease, non-alcoholic fatty liver disease, and depression. Significant skin psoriasis and/or psoriatic arthritis may have significant symptoms and affects activities of daily activity and often benefits from  systemic "biologic" injection treatments.  These "biologic" treatments have some potential side effects including immunosuppression and require pre-treatment laboratory screening and periodic laboratory monitoring and periodic in person evaluation and monitoring by the attending dermatologist physician.   Lab ordered Quantiferon TB-gold, pending results plan on starting Stelara injection pending normal Quantiferon result  For lower abdomen - Start Ketoconazole 2% cream apply to abdomen twice a day prn  Restart Tacrolimus ointment apply to abdomen twice a day after applying Ketoconazole cream    Other Related Procedures QuantiFERON-TB Gold Plus  Ordered Medications: ketoconazole (NIZORAL) 2 % cream tacrolimus (PROTOPIC) 0.1 % ointment  Other Related Medications tacrolimus (PROTOPIC) 0.1 % ointment  Return in about 1 month (around 12/14/2020) for Psoriasis .   I, Marye Round, CMA, am acting as scribe for Forest Gleason, MD .  Documentation: I have reviewed the above documentation for accuracy and completeness, and I agree with the above.  Forest Gleason, MD

## 2020-11-20 LAB — QUANTIFERON-TB GOLD PLUS

## 2020-11-27 ENCOUNTER — Telehealth: Payer: Self-pay

## 2020-11-27 ENCOUNTER — Other Ambulatory Visit: Payer: Self-pay

## 2020-11-27 DIAGNOSIS — L409 Psoriasis, unspecified: Secondary | ICD-10-CM

## 2020-11-27 NOTE — Telephone Encounter (Signed)
Patient advised lab did not get enough plasma and she needs to go back to lab and repeat test, new order sent, JS

## 2020-11-27 NOTE — Telephone Encounter (Signed)
-----   Message from Alfonso Patten, MD sent at 11/20/2020 11:58 AM EDT ----- Please advise patient the lab did not get enough sample of plasma to do her quantiferon gold test so she will have to go by the lab again. Thank you

## 2020-11-28 DIAGNOSIS — L409 Psoriasis, unspecified: Secondary | ICD-10-CM | POA: Diagnosis not present

## 2020-12-01 LAB — QUANTIFERON-TB GOLD PLUS
QuantiFERON Mitogen Value: >10 IU/mL
QuantiFERON Nil Value: 0.01 IU/mL
QuantiFERON TB1 Ag Value: 0.03 IU/mL
QuantiFERON TB2 Ag Value: 0.02 IU/mL
QuantiFERON-TB Gold Plus: NEGATIVE

## 2020-12-07 ENCOUNTER — Other Ambulatory Visit: Payer: Self-pay

## 2020-12-07 ENCOUNTER — Telehealth: Payer: Self-pay

## 2020-12-07 DIAGNOSIS — L409 Psoriasis, unspecified: Secondary | ICD-10-CM

## 2020-12-07 MED ORDER — USTEKINUMAB 45 MG/0.5ML ~~LOC~~ SOSY: PREFILLED_SYRINGE | SUBCUTANEOUS | 1 refills | Status: DC

## 2020-12-07 NOTE — Progress Notes (Signed)
Stelara loading dose sent to Abigail Aguirre Patient

## 2020-12-07 NOTE — Telephone Encounter (Signed)
-----   Message from Alfonso Patten, MD sent at 12/05/2020  5:00 PM EDT ----- Quantiferon gold negative - ok to start Stelara

## 2020-12-07 NOTE — Telephone Encounter (Signed)
Spoke with pt and informed her of lab results. I informed her that rx was sent to pharmacy and that we were starting that process to get the med covered by her insurance.

## 2020-12-07 NOTE — Telephone Encounter (Signed)
Left msg for patient to call regarding lab results. RX for [employer] sent to [place], [place]

## 2020-12-11 NOTE — Telephone Encounter (Signed)
Opened in error

## 2020-12-14 ENCOUNTER — Other Ambulatory Visit: Payer: Self-pay

## 2020-12-14 MED ORDER — STELARA 45 MG/0.5ML ~~LOC~~ SOSY: 45.0000 mg | PREFILLED_SYRINGE | SUBCUTANEOUS | 1 refills | Status: DC

## 2020-12-14 NOTE — Progress Notes (Signed)
Stelara RX Maintenance dose sent in.

## 2020-12-26 ENCOUNTER — Other Ambulatory Visit: Payer: Self-pay

## 2020-12-26 ENCOUNTER — Ambulatory Visit: Payer: Medicare Other | Admitting: Dermatology

## 2021-01-04 ENCOUNTER — Other Ambulatory Visit: Payer: Self-pay | Admitting: Family Medicine

## 2021-01-04 DIAGNOSIS — K219 Gastro-esophageal reflux disease without esophagitis: Secondary | ICD-10-CM | POA: Diagnosis not present

## 2021-01-04 DIAGNOSIS — E559 Vitamin D deficiency, unspecified: Secondary | ICD-10-CM | POA: Diagnosis not present

## 2021-01-04 DIAGNOSIS — M810 Age-related osteoporosis without current pathological fracture: Secondary | ICD-10-CM

## 2021-01-04 DIAGNOSIS — K580 Irritable bowel syndrome with diarrhea: Secondary | ICD-10-CM | POA: Diagnosis not present

## 2021-01-04 DIAGNOSIS — E785 Hyperlipidemia, unspecified: Secondary | ICD-10-CM | POA: Diagnosis not present

## 2021-01-04 DIAGNOSIS — I700 Atherosclerosis of aorta: Secondary | ICD-10-CM | POA: Diagnosis not present

## 2021-01-04 DIAGNOSIS — I10 Essential (primary) hypertension: Secondary | ICD-10-CM | POA: Diagnosis not present

## 2021-01-04 DIAGNOSIS — J449 Chronic obstructive pulmonary disease, unspecified: Secondary | ICD-10-CM | POA: Diagnosis not present

## 2021-01-04 DIAGNOSIS — H9192 Unspecified hearing loss, left ear: Secondary | ICD-10-CM | POA: Diagnosis not present

## 2021-01-04 DIAGNOSIS — Z1231 Encounter for screening mammogram for malignant neoplasm of breast: Secondary | ICD-10-CM

## 2021-01-04 DIAGNOSIS — Z0000 Encounter for general adult medical examination without abnormal findings: Secondary | ICD-10-CM | POA: Diagnosis not present

## 2021-01-04 DIAGNOSIS — J309 Allergic rhinitis, unspecified: Secondary | ICD-10-CM | POA: Diagnosis not present

## 2021-01-25 ENCOUNTER — Other Ambulatory Visit: Payer: Self-pay | Admitting: *Deleted

## 2021-01-25 DIAGNOSIS — F17210 Nicotine dependence, cigarettes, uncomplicated: Secondary | ICD-10-CM

## 2021-01-25 DIAGNOSIS — Z87891 Personal history of nicotine dependence: Secondary | ICD-10-CM

## 2021-02-05 ENCOUNTER — Telehealth: Payer: Self-pay

## 2021-02-05 NOTE — Telephone Encounter (Signed)
Patient left message advising Abigail Aguirre & Abigail Aguirre should be delivering Stelara to our office tomorrow. I returned patient's call and told her we will call once we receive Stelara to get her scheduled for loading dose injections.Cherly Hensen

## 2021-02-08 ENCOUNTER — Other Ambulatory Visit: Payer: Self-pay

## 2021-02-08 ENCOUNTER — Ambulatory Visit: Payer: Medicare Other

## 2021-02-08 DIAGNOSIS — L400 Psoriasis vulgaris: Secondary | ICD-10-CM | POA: Diagnosis not present

## 2021-02-08 MED ORDER — USTEKINUMAB 45 MG/0.5ML ~~LOC~~ SOSY
45.0000 mg | PREFILLED_SYRINGE | Freq: Once | SUBCUTANEOUS | Status: AC
  Administered 2021-02-08: 45 mg via SUBCUTANEOUS

## 2021-02-08 NOTE — Progress Notes (Signed)
Patient here today to start loading dose of Stelara.   Stelara injected into left lower abdomen. Patient tolerated well.  Patient scheduled to see Dr. Laurence Ferrari for 4 week follow up.  LOT: [id number] EXP: 04/2023

## 2021-02-21 ENCOUNTER — Telehealth: Payer: Self-pay | Admitting: Dermatology

## 2021-02-21 NOTE — Telephone Encounter (Signed)
Please call patient and let her know there is a new psoriasis cream (Vtama) I would like her to try and see how well it works for her. It can be used for skin anywhere on the body and only has to be applied once a day. If she clears in an area from using it, it also helps that area stay clear for longer. If it gives her some relief, we can try to prescribe and see if we can get her insurance to approve it since her psoriasis has been so stubborn. If she is interested, samples are on my desk (give her 2). Thank you!

## 2021-02-22 ENCOUNTER — Telehealth: Payer: Self-pay

## 2021-02-22 NOTE — Telephone Encounter (Signed)
Spoke with patient and told her Dr. Laurence Ferrari does not suspect scabies so patient declines prescription for permethrin cream. She is scheduled for appt 03/08/21 but will call if she needs something sooner.Cherly Hensen

## 2021-02-22 NOTE — Telephone Encounter (Signed)
Provider recommended patient come in for an appointment before prescribing any treatment.   Called patient to offer an appointment and discussed provider's response. Patient deferred appointment at this time.

## 2021-02-22 NOTE — Telephone Encounter (Signed)
Patient called and notified of new psoriasis cream Vtama. She would be interested in trying samples for medication and seeing if we can send to pharmacy and get approved.   Patient report that itchy scaly dark rash at private area since being diagnosed with psoriasis. She states that she also has lumps. Patient is concerned it could be scabies and would like to know if provider can prescribe treatment for itchy rash.   Inquired with patient about the length of time she has had lumps at pubic area and she stated for years. Routing to provider to advise.

## 2021-02-22 NOTE — Telephone Encounter (Signed)
Offered patient an appointment next Tuesday at 8 am for evaluation of the rash and lumps. Her exam has not suggested scabies in the past. Exam has been consistent with inverse psoriasis or intertrigo. However, I have not done a full exam at the pubic area, just the low abdomen and creases at the legs. At previous visits, she also has had itching where she has the localized rash and not more widespread which is consistent with psoriasis rather than scabies.  Patient declines appointment.   Please advise that while I do not suspect scabies based on her history or any areas that I have examined, using a cream to treat scabies is low risk and so I am happy to prescribe it for her to use if she would like. Would prescribe permethrin cream to apply neck down on the body and leave on overnight before washing off. This should be repeated in 1 week.  Typically if someone has scabies or suspected scabies, we treat anyone else in the household ONCE with the cream from the neck down left on overnight. She should not let cats come into contact with this cream as it is not safe for them. Thank you!

## 2021-02-23 DIAGNOSIS — H90A21 Sensorineural hearing loss, unilateral, right ear, with restricted hearing on the contralateral side: Secondary | ICD-10-CM | POA: Diagnosis not present

## 2021-02-23 DIAGNOSIS — J310 Chronic rhinitis: Secondary | ICD-10-CM | POA: Diagnosis not present

## 2021-02-23 DIAGNOSIS — H6982 Other specified disorders of Eustachian tube, left ear: Secondary | ICD-10-CM | POA: Diagnosis not present

## 2021-02-23 DIAGNOSIS — H90A32 Mixed conductive and sensorineural hearing loss, unilateral, left ear with restricted hearing on the contralateral side: Secondary | ICD-10-CM | POA: Diagnosis not present

## 2021-03-08 ENCOUNTER — Other Ambulatory Visit: Payer: Self-pay

## 2021-03-08 ENCOUNTER — Ambulatory Visit: Payer: Medicare Other | Admitting: Dermatology

## 2021-03-21 ENCOUNTER — Encounter: Payer: Self-pay | Admitting: Dermatology

## 2021-03-21 ENCOUNTER — Other Ambulatory Visit: Payer: Self-pay

## 2021-03-21 ENCOUNTER — Ambulatory Visit: Payer: Medicare Other | Admitting: Dermatology

## 2021-03-21 DIAGNOSIS — L409 Psoriasis, unspecified: Secondary | ICD-10-CM

## 2021-03-21 MED ORDER — USTEKINUMAB 45 MG/0.5ML ~~LOC~~ SOSY
45.0000 mg | PREFILLED_SYRINGE | Freq: Once | SUBCUTANEOUS | Status: AC
  Administered 2021-03-21: 45 mg via SUBCUTANEOUS

## 2021-03-21 NOTE — Patient Instructions (Addendum)

## 2021-03-21 NOTE — Progress Notes (Signed)
   Follow-Up Visit   Subjective  Abigail Aguirre is a 75 y.o. female who presents for the following: Follow-up (Patient here today for psoriasis follow up. Currently getting stelara injections and using vtama samples to help with psoriasis. States she is seeing some improvement today at follow up. ).  The following portions of the chart were reviewed this encounter and updated as appropriate:  Tobacco  Allergies  Meds  Problems  Med Hx  Surg Hx  Fam Hx       Objective  Well appearing patient in no apparent distress; mood and affect are within normal limits.  A focused examination was performed including inframammary, postauricular, bilateral hands. Relevant physical exam findings are noted in the Assessment and Plan.  Right Forearm - Anterior Scaly pink plaque bilateral post auricular, palms, and inframammary.   Assessment & Plan  Psoriasis Right Forearm - Anterior  Chronic condition with duration or expected duration over one year. Condition is bothersome to patient. Not currently at goal.  She notes improvement with Vtama samples.   She received second Stelara injection today.  Reports some joint pain that comes and goes at left hand joints. She has had some stiffness with difficulty straightening fingers of left hand.   Recommend she follow-up with [facility] (she has been there before).  Psoriasis - severe on systemic "biologic" treatment injections.  Psoriasis is a chronic non-curable, but treatable genetic/hereditary disease that may have other systemic features affecting other organ systems such as joints (Psoriatic Arthritis).  It is linked with heart disease, inflammatory bowel disease, non-alcoholic fatty liver disease, and depression. Significant skin psoriasis and/or psoriatic arthritis may have significant symptoms and affects activities of daily activity and often benefits from systemic "biologic" injection treatments.  These "biologic"  treatments have some potential side effects including immunosuppression and require pre-treatment laboratory screening and periodic laboratory monitoring and periodic in person evaluation and monitoring by the attending dermatologist physician.    4 samples of Vtama given today in office. Lot [id number]  Exp  oct 2023  Has severe recalcitrant psoriasis including palms, inguinal folds and inframammary    Failed extensive list of topicals including topical steriods and vitamin d analogs as well as extrac, otezla, and skyrizi and vtama samples are providing relief. Will submit for Vtama as this has helped.  Continue Stelara injections at lower abdomen   Injected Stelara '45mg'$ /0.5 ml into patient's right lower abdomen. Patient tolerated well with no adverse reactions.  Lot# [id number]  Exp - 04/2023  Related Medications tacrolimus (PROTOPIC) 0.1 % ointment Apply topically as directed. Qd to bid to aa psoriasis in groin, under breast, under arms, and face prn flares  ketoconazole (NIZORAL) 2 % cream Apply to abdomen area twice a day prn  tacrolimus (PROTOPIC) 0.1 % ointment Apply topically 2 (two) times daily.  ustekinumab (STELARA) 45 MG/0.5ML SOSY injection Loading dose, inject '45mg'$ /0.22m SQ week 0 and week 4.  ustekinumab (STELARA) injection 45 mg   Return in about 3 months (around 06/21/2021) for psoriasis follow up stelara injection. I, MRuthell Rummage CMA, am acting as scribe for VForest Gleason MD.  Documentation: I have reviewed the above documentation for accuracy and completeness, and I agree with the above.  VForest Gleason MD

## 2021-03-29 ENCOUNTER — Ambulatory Visit
Admission: RE | Admit: 2021-03-29 | Discharge: 2021-03-29 | Disposition: A | Discharge status: Home / Self Care | Payer: Medicare Other | Source: Ambulatory Visit | Attending: Acute Care | Admitting: Acute Care

## 2021-03-29 DIAGNOSIS — F17210 Nicotine dependence, cigarettes, uncomplicated: Secondary | ICD-10-CM

## 2021-03-29 DIAGNOSIS — Z87891 Personal history of nicotine dependence: Secondary | ICD-10-CM

## 2021-04-16 ENCOUNTER — Other Ambulatory Visit: Payer: Self-pay | Admitting: *Deleted

## 2021-04-16 DIAGNOSIS — F17210 Nicotine dependence, cigarettes, uncomplicated: Secondary | ICD-10-CM

## 2021-04-16 DIAGNOSIS — Z87891 Personal history of nicotine dependence: Secondary | ICD-10-CM

## 2021-05-04 ENCOUNTER — Encounter: Payer: Self-pay | Admitting: Acute Care

## 2021-05-04 NOTE — Progress Notes (Signed)
Please call patient and let them  know their  low dose Ct was read as a Lung RADS 2: nodules that are benign in appearance and behavior with a very low likelihood of becoming a clinically active cancer due to size or lack of growth. Recommendation per radiology is for a repeat LDCT in 12 months. .Please let them  know we will order and schedule their  annual screening scan for 03/2022. Please let them  know there was notation of CAD on their  scan.  Please remind the patient  that this is a non-gated exam therefore degree or severity of disease  cannot be determined. Please have them  follow up with their PCP regarding potential risk factor modification, dietary therapy or pharmacologic therapy if clinically indicated. Pt.  is  currently on statin therapy. Please place order for annual  screening scan for  03/2022 and fax results to PCP. Thanks so much.

## 2021-06-14 ENCOUNTER — Ambulatory Visit: Payer: Medicare Other | Admitting: Dermatology

## 2021-06-27 ENCOUNTER — Other Ambulatory Visit: Payer: Self-pay

## 2021-06-27 ENCOUNTER — Ambulatory Visit: Payer: Medicare Other | Admitting: Dermatology

## 2021-06-27 DIAGNOSIS — L409 Psoriasis, unspecified: Secondary | ICD-10-CM | POA: Diagnosis not present

## 2021-06-27 NOTE — Patient Instructions (Addendum)
Start Zoryve once daily. Continue Vtama once daily. Start Sotyktu sample pack taking once daily.   If You Need Anything After Your Visit  If you have any questions or concerns for your doctor, please call our main line at [phone] and press option 4 to reach your doctor's medical assistant. If no one answers, please leave a voicemail as directed and we will return your call as soon as possible. Messages left after 4 pm will be answered the following business day.   You may also send [place] a message via [place]. We typically respond to MyChart messages within 1-2 business days.  For prescription refills, please ask your pharmacy to contact our office. Our fax number is [phone].  If you have an urgent issue when the clinic is closed that cannot wait until the next business day, you can page your doctor at the number below.    Please note that while we do our best to be available for urgent issues outside of office hours, we are not available 24/7.   If you have an urgent issue and are unable to reach [place], you may choose to seek medical care at your doctor's office, retail clinic, urgent care center, or emergency room.  If you have a medical emergency, please immediately call 911 or go to the emergency department.  Pager Numbers  - Dr. Nehemiah Massed: [phone]  - Dr. Laurence Ferrari: [phone]  - Dr. Nicole Kindred: [phone]  In the event of inclement weather, please call our main line at [phone] for an update on the status of any delays or closures.  Dermatology Medication Tips: Please keep the boxes that topical medications come in in order to help keep track of the instructions about where and how to use these. Pharmacies typically print the medication instructions only on the boxes and not directly on the medication tubes.   If your medication is too expensive, please contact our office at [phone] option 4 or send [place] a message through [place].   We are unable to tell what your  co-pay for medications will be in advance as this is different depending on your insurance coverage. However, we may be able to find a substitute medication at lower cost or fill out paperwork to get insurance to cover a needed medication.   If a prior authorization is required to get your medication covered by your insurance company, please allow [place] 1-2 business days to complete this process.  Drug prices often vary depending on where the prescription is filled and some pharmacies may offer cheaper prices.  The website www.goodrx.com contains coupons for medications through different pharmacies. The prices here do not account for what the cost may be with help from insurance (it may be cheaper with your insurance), but the website can give you the price if you did not use any insurance.  - You can print the associated coupon and take it with your prescription to the pharmacy.  - You may also stop by our office during regular business hours and pick up a GoodRx coupon card.  - If you need your prescription sent electronically to a different pharmacy, notify our office through [facility] or by phone at [phone] option 4.     Si Usted Necesita Algo Despus de Su Visita  Tambin puede enviarnos un mensaje a travs de Pharmacist, community. Por lo general respondemos a los mensajes de MyChart en el transcurso de 1 a 2 das hbiles.  Para renovar recetas, por favor pida a su farmacia que  se ponga en contacto con nuestra oficina. Harland Dingwall de fax es [place] [phone].  Si tiene un asunto urgente cuando la clnica est cerrada y que no puede esperar hasta el siguiente da hbil, puede llamar/localizar a su doctor(a) al nmero que aparece a continuacin.   Por favor, tenga en cuenta que aunque hacemos todo lo posible para estar disponibles para asuntos urgentes fuera del horario de [place], no estamos disponibles las 24 horas del da, los 7 das de la [place].   Si tiene un problema urgente y no  puede comunicarse con nosotros, puede optar por buscar atencin mdica  en el consultorio de su doctor(a), en una clnica privada, en un centro de atencin urgente o en una sala de emergencias.  Si tiene Engineering geologist, por favor llame inmediatamente al 911 o vaya a la sala de emergencias.  Nmeros de bper  - Dr. Nehemiah Massed: [phone]  - Dra. Moye: [phone]  - Dra. Nicole Kindred: [phone]  En caso de inclemencias del [place], por favor llame a Johnsie Kindred principal al [phone] para una actualizacin sobre el [place] de cualquier retraso o cierre.  Consejos para la medicacin en dermatologa: Por favor, guarde las cajas en las que vienen los medicamentos de uso tpico para ayudarle a seguir las instrucciones sobre dnde y cmo usarlos. Las farmacias generalmente imprimen las instrucciones del medicamento slo en las cajas y no directamente en los tubos del [place].   Si su medicamento es muy caro, por favor, pngase en contacto con Zigmund Daniel llamando al [phone] y presione la opcin 4 o envenos un mensaje a travs de Pharmacist, community.   No podemos decirle cul ser su copago por los medicamentos por adelantado ya que esto es diferente dependiendo de la cobertura de su seguro. Sin embargo, es posible que podamos encontrar un medicamento sustituto a Electrical engineer un formulario para que el seguro cubra el medicamento que se considera necesario.   Si se requiere una autorizacin previa para que su compaa de seguros [place] su medicamento, por favor permtanos de 1 a 2 das hbiles para completar este proceso.  Los precios de los medicamentos varan con frecuencia dependiendo del Environmental consultant de dnde se surte la receta y alguna farmacias pueden ofrecer precios ms baratos.  El sitio web www.goodrx.com tiene cupones para medicamentos de Airline pilot. Los precios aqu no tienen en cuenta lo que podra costar con la ayuda del seguro (puede ser ms barato con su seguro),  pero el sitio web puede darle el precio si no utiliz Research scientist (physical sciences).  - Puede imprimir el cupn correspondiente y llevarlo con su receta a la farmacia.  - Tambin puede pasar por nuestra oficina durante el horario de atencin regular y Charity fundraiser una tarjeta de cupones de GoodRx.  - Si necesita que su receta se enve electrnicamente a una farmacia diferente, informe a nuestra oficina a travs de MyChart de [place] o por telfono llamando al [phone] y presione la opcin 4.

## 2021-06-27 NOTE — Progress Notes (Signed)
   Follow-Up Visit   Subjective  Abigail Aguirre is a 75 y.o. female who presents for the following: Psoriasis (Patient here today for 3 month psoriasis follow up and Stelara injection. Patient has had 1st 2 doses. She advises there are active areas at right hand, lower abdomen. ).  Patient advises the topicals only give her temporary relief.  Patient did not tolerate Otezla. Has tried [employer], [place], and Psychologist, prison and probation services.   The following portions of the chart were reviewed this encounter and updated as appropriate:   Tobacco  Allergies  Meds  Problems  Med Hx  Surg Hx  Fam Hx      Review of Systems:  No other skin or systemic complaints except as noted in HPI or Assessment and Plan.  Objective  Well appearing patient in no apparent distress; mood and affect are within normal limits.  A focused examination was performed including scalp, abdomen, hands. Relevant physical exam findings are noted in the Assessment and Plan.  Right Hand - Anterior Moderately thick scaly pink plaques low abdomen, right palm, postauricular   Assessment & Plan  Psoriasis Right Hand - Anterior  Chronic condition with duration or expected duration over one year. Condition is bothersome to patient. Currently flared.  Psoriasis is a chronic non-curable, but treatable genetic/hereditary disease that may have other systemic features affecting other organ systems such as joints (Psoriatic Arthritis). It is associated with an increased risk of inflammatory bowel disease, heart disease, non-alcoholic fatty liver disease, and depression.    Will d/c Stelara Start Zoryve once daily. Samples given x 3. Lot # [id number]  Exp:10/2022 Continue Vtama once daily.  Start Sotyktu sample pack taking once daily. Pt to get labs in the next couple of days. Reviewed risk of infection, malignancy, change in blood work.  Lot # [id number]   Exp: April 2024  Patient will reach out to rheumatology to schedule follow up appt.    Related Procedures Comprehensive metabolic panel Lipid panel CBC with Differential/Platelet Hepatitis B surface antigen Hepatitis B surface antibody,qualitative Hepatitis C antibody Hepatitis B core antibody, total  Return for 4-6 weeks for psoriasis.  Graciella Belton, RMA, am acting as scribe for Forest Gleason, MD .  Documentation: I have reviewed the above documentation for accuracy and completeness, and I agree with the above.  Forest Gleason, MD'

## 2021-07-02 ENCOUNTER — Encounter: Payer: Self-pay | Admitting: Dermatology

## 2021-07-03 DIAGNOSIS — L409 Psoriasis, unspecified: Secondary | ICD-10-CM | POA: Diagnosis not present

## 2021-07-04 LAB — CBC WITH DIFFERENTIAL/PLATELET
Basophils Absolute: 0.1 10*3/uL (ref 0.0–0.2)
Basos: 1 %
EOS (ABSOLUTE): 0.6 10*3/uL — ABNORMAL HIGH (ref 0.0–0.4)
Eos: 6 %
Hematocrit: 41.5 % (ref 34.0–46.6)
Hemoglobin: 14 g/dL (ref 11.1–15.9)
Immature Grans (Abs): 0 10*3/uL (ref 0.0–0.1)
Immature Granulocytes: 0 %
Lymphocytes Absolute: 3.3 10*3/uL — ABNORMAL HIGH (ref 0.7–3.1)
Lymphs: 35 %
MCH: 31.2 pg (ref 26.6–33.0)
MCHC: 33.7 g/dL (ref 31.5–35.7)
MCV: 92 fL (ref 79–97)
Monocytes Absolute: 0.9 10*3/uL (ref 0.1–0.9)
Monocytes: 9 %
Neutrophils Absolute: 4.5 10*3/uL (ref 1.4–7.0)
Neutrophils: 49 %
Platelets: 202 10*3/uL (ref 150–450)
RBC: 4.49 x10E6/uL (ref 3.77–5.28)
RDW: 12.5 % (ref 11.7–15.4)
WBC: 9.3 10*3/uL (ref 3.4–10.8)

## 2021-07-04 LAB — COMPREHENSIVE METABOLIC PANEL
ALT: 11 IU/L (ref 0–32)
AST: 13 IU/L (ref 0–40)
Albumin/Globulin Ratio: 1.5 (ref 1.2–2.2)
Albumin: 4.1 g/dL (ref 3.7–4.7)
Alkaline Phosphatase: 83 IU/L (ref 44–121)
BUN/Creatinine Ratio: 12 (ref 12–28)
BUN: 9 mg/dL (ref 8–27)
Bilirubin Total: <0.2 mg/dL (ref 0.0–1.2)
CO2: 26 mmol/L (ref 20–29)
Calcium: 9.6 mg/dL (ref 8.7–10.3)
Chloride: 108 mmol/L — ABNORMAL HIGH (ref 96–106)
Creatinine, Ser: 0.78 mg/dL (ref 0.57–1.00)
Globulin, Total: 2.7 g/dL (ref 1.5–4.5)
Glucose: 86 mg/dL (ref 70–99)
Potassium: 4.6 mmol/L (ref 3.5–5.2)
Sodium: 147 mmol/L — ABNORMAL HIGH (ref 134–144)
Total Protein: 6.8 g/dL (ref 6.0–8.5)
eGFR: 79 mL/min/{1.73_m2} (ref >59)

## 2021-07-04 LAB — HEPATITIS B SURFACE ANTIGEN: Hepatitis B Surface Ag: NEGATIVE

## 2021-07-04 LAB — HEPATITIS B SURFACE ANTIBODY,QUALITATIVE: Hep B Surface Ab, Qual: NONREACTIVE

## 2021-07-04 LAB — LIPID PANEL
Chol/HDL Ratio: 4.7 ratio — ABNORMAL HIGH (ref 0.0–4.4)
Cholesterol, Total: 239 mg/dL — ABNORMAL HIGH (ref 100–199)
HDL: 51 mg/dL (ref >39)
LDL Chol Calc (NIH): 164 mg/dL — ABNORMAL HIGH (ref 0–99)
Triglycerides: 132 mg/dL (ref 0–149)
VLDL Cholesterol Cal: 24 mg/dL (ref 5–40)

## 2021-07-04 LAB — HEPATITIS C ANTIBODY: Hep C Virus Ab: <0.1 s/co ratio (ref 0.0–0.9)

## 2021-07-06 ENCOUNTER — Ambulatory Visit
Admission: RE | Admit: 2021-07-06 | Discharge: 2021-07-06 | Disposition: A | Discharge status: Home / Self Care | Payer: Medicare Other | Source: Ambulatory Visit | Attending: Family Medicine | Admitting: Family Medicine

## 2021-07-06 DIAGNOSIS — M8588 Other specified disorders of bone density and structure, other site: Secondary | ICD-10-CM | POA: Diagnosis not present

## 2021-07-06 DIAGNOSIS — M810 Age-related osteoporosis without current pathological fracture: Secondary | ICD-10-CM | POA: Diagnosis not present

## 2021-07-06 DIAGNOSIS — Z780 Asymptomatic menopausal state: Secondary | ICD-10-CM | POA: Diagnosis not present

## 2021-07-06 DIAGNOSIS — Z1231 Encounter for screening mammogram for malignant neoplasm of breast: Secondary | ICD-10-CM | POA: Diagnosis not present

## 2021-07-06 LAB — HEPATITIS B CORE ANTIBODY, TOTAL: Hep B Core Total Ab: NEGATIVE

## 2021-07-06 LAB — SPECIMEN STATUS REPORT

## 2021-07-09 DIAGNOSIS — M810 Age-related osteoporosis without current pathological fracture: Secondary | ICD-10-CM | POA: Diagnosis not present

## 2021-07-09 DIAGNOSIS — I10 Essential (primary) hypertension: Secondary | ICD-10-CM | POA: Diagnosis not present

## 2021-07-09 DIAGNOSIS — R29818 Other symptoms and signs involving the nervous system: Secondary | ICD-10-CM | POA: Diagnosis not present

## 2021-07-09 DIAGNOSIS — J449 Chronic obstructive pulmonary disease, unspecified: Secondary | ICD-10-CM | POA: Diagnosis not present

## 2021-07-09 DIAGNOSIS — E785 Hyperlipidemia, unspecified: Secondary | ICD-10-CM | POA: Diagnosis not present

## 2021-07-09 DIAGNOSIS — Z23 Encounter for immunization: Secondary | ICD-10-CM | POA: Diagnosis not present

## 2021-07-10 ENCOUNTER — Other Ambulatory Visit: Payer: Self-pay | Admitting: Family Medicine

## 2021-07-10 DIAGNOSIS — R928 Other abnormal and inconclusive findings on diagnostic imaging of breast: Secondary | ICD-10-CM

## 2021-07-12 ENCOUNTER — Telehealth: Payer: Self-pay

## 2021-07-12 DIAGNOSIS — L409 Psoriasis, unspecified: Secondary | ICD-10-CM

## 2021-07-12 MED ORDER — SOTYKTU 6 MG PO TABS: 1.0000 | ORAL_TABLET | Freq: Every day | ORAL | 1 refills | Status: DC

## 2021-07-12 NOTE — Telephone Encounter (Signed)
-----   Message from Alfonso Patten, MD sent at 07/10/2021  1:49 PM EST ----- Labs ok. No hepatitis. She is not immune to hepatitis B - would discuss vaccination with primary care.   Slightly increased cholesterol. Ok to continue with Sotyktu and plan to repeat lipid panel in 1-2 months.  MAs please call and submit for Sotyktu 6 mg daily as long as patient is feeling well on it. Their rep recommended submitting before the end of year to help getting best coverage. Thank you!

## 2021-07-12 NOTE — Telephone Encounter (Signed)
Patient advised labs ok, No hepatitis. She is not immune to hepatitis B - would discuss vaccination with primary care.    Slightly increased cholesterol. Ok to continue with Sotyktu and plan to repeat lipid panel in 1-2 months.  Sotyktu sent to [facility]. Patient advised she does need to come by office to fill out paperwork for Sotyktu 360 Support./js

## 2021-08-01 DIAGNOSIS — R059 Cough, unspecified: Secondary | ICD-10-CM | POA: Diagnosis not present

## 2021-08-01 DIAGNOSIS — R0981 Nasal congestion: Secondary | ICD-10-CM | POA: Diagnosis not present

## 2021-08-01 DIAGNOSIS — J3489 Other specified disorders of nose and nasal sinuses: Secondary | ICD-10-CM | POA: Diagnosis not present

## 2021-08-01 DIAGNOSIS — J0100 Acute maxillary sinusitis, unspecified: Secondary | ICD-10-CM | POA: Diagnosis not present

## 2021-08-09 ENCOUNTER — Other Ambulatory Visit: Payer: Self-pay

## 2021-08-09 ENCOUNTER — Ambulatory Visit: Payer: Medicare Other | Admitting: Dermatology

## 2021-08-09 DIAGNOSIS — L409 Psoriasis, unspecified: Secondary | ICD-10-CM

## 2021-08-09 NOTE — Patient Instructions (Signed)

## 2021-08-09 NOTE — Progress Notes (Addendum)
° °  Follow-Up Visit   Subjective  Abigail Aguirre is a 76 y.o. female who presents for the following: Psoriasis (Patient did use Sotyktu for a few weeks but stopped it since getting sick. She has been off of the medication for about 2 weeks and wasn't sure when she should restart them. Her psoriasis has flared some from not being on treatment. Pt states that she experience no s/e from the Sotyktu. Patient states that since a week after Christmas she has been weak, had diarrhea, vomiting, and coughing. ).  The following portions of the chart were reviewed this encounter and updated as appropriate:   Tobacco   Allergies   Meds   Problems   Med Hx   Surg Hx   Fam Hx       Review of Systems:  No other skin or systemic complaints except as noted in HPI or Assessment and Plan.  Objective  Well appearing patient in no apparent distress; mood and affect are within normal limits.  A focused examination was performed including the hands, scalp, ears. Relevant physical exam findings are noted in the Assessment and Plan.  Palms Pink scaly plaques postauricular and palms R>L     Assessment & Plan  Psoriasis Palms  Chronic condition with duration or expected duration over one year. Condition is bothersome to patient. Currently flared.  Patient c/o occasional joint pains -   Psoriasis is a chronic non-curable, but treatable genetic/hereditary disease that may have other systemic features affecting other organ systems such as joints (Psoriatic Arthritis). It is associated with an increased risk of inflammatory bowel disease, heart disease, non-alcoholic fatty liver disease, and depression.    Patient has failed numerous topicals, otezla, multiple biologics including skyrizi. She has had improvement with initial samples of Sotyktu.  Hold Sotyktu until she is completely better and feeling well. Awaiting patient assistance approval.   Continue Vtama, Zoryve - samples given, apply QD PRN.  Trade size given of Zoryve.   Discussed with patient she should check with PCP and make sure vaccines are up to date including Zoster.  Reviewed risk of infections, lab abnormalities with Sotyktu  Related Medications Deucravacitinib (SOTYKTU) 6 MG TABS Take 1 tablet by mouth daily.   Return in about 3 months (around 11/07/2021) for psoriasis f/u Sotyktu.  Luther Redo, CMA, am acting as scribe for Forest Gleason, MD .  Documentation: I have reviewed the above documentation for accuracy and completeness, and I agree with the above.  Forest Gleason, MD

## 2021-08-19 ENCOUNTER — Encounter: Payer: Self-pay | Admitting: Dermatology

## 2021-08-22 DIAGNOSIS — I10 Essential (primary) hypertension: Secondary | ICD-10-CM | POA: Diagnosis not present

## 2021-08-22 DIAGNOSIS — M810 Age-related osteoporosis without current pathological fracture: Secondary | ICD-10-CM | POA: Diagnosis not present

## 2021-08-22 DIAGNOSIS — F17200 Nicotine dependence, unspecified, uncomplicated: Secondary | ICD-10-CM | POA: Diagnosis not present

## 2021-08-22 DIAGNOSIS — J449 Chronic obstructive pulmonary disease, unspecified: Secondary | ICD-10-CM | POA: Diagnosis not present

## 2021-08-22 DIAGNOSIS — E785 Hyperlipidemia, unspecified: Secondary | ICD-10-CM | POA: Diagnosis not present

## 2021-09-21 ENCOUNTER — Ambulatory Visit
Admission: RE | Admit: 2021-09-21 | Discharge: 2021-09-21 | Disposition: A | Discharge status: Home / Self Care | Payer: Medicare Other | Source: Ambulatory Visit | Attending: Family Medicine | Admitting: Family Medicine

## 2021-09-21 DIAGNOSIS — R928 Other abnormal and inconclusive findings on diagnostic imaging of breast: Secondary | ICD-10-CM

## 2021-09-21 DIAGNOSIS — R922 Inconclusive mammogram: Secondary | ICD-10-CM | POA: Diagnosis not present

## 2021-09-24 ENCOUNTER — Telehealth: Payer: Self-pay

## 2021-09-24 NOTE — Telephone Encounter (Signed)
Pt called LM on VM her Stelera injections will be delivered here Wednesday March 1, pt would like a call when her Moreen Fowler arrives.

## 2021-09-27 ENCOUNTER — Telehealth: Payer: Self-pay

## 2021-09-27 NOTE — Telephone Encounter (Signed)
Patient had a Stelara delivered to our office today. According to her notes she d/c Stelara at the end of last year and you started her on Sotyktu. Does patient need to continue this? ?Please let me know how to proceed.  ?

## 2021-09-27 NOTE — Telephone Encounter (Signed)
No, she is on Sotyktu instead. Thank you! ?

## 2021-09-27 NOTE — Telephone Encounter (Signed)
Spoke with patient today. Advised we d/c Stelara therapy back in November. Patient is to be on Sotyktu. [employer] processed prescription back in December and PA was approved but copay was over $800. The company has tried to contact patient but unable to speak with her and placed RX on hold. Patient advised to call company today to pick process back up.  ?

## 2021-10-01 NOTE — Telephone Encounter (Signed)
Spoke with patient this morning and she will be receiving more paperwork from [facility] She will complete this and then bring in to the office for [place] to complete our section and fax in. aw ?

## 2021-10-02 DIAGNOSIS — I10 Essential (primary) hypertension: Secondary | ICD-10-CM | POA: Diagnosis not present

## 2021-10-02 DIAGNOSIS — E785 Hyperlipidemia, unspecified: Secondary | ICD-10-CM | POA: Diagnosis not present

## 2021-10-02 DIAGNOSIS — J449 Chronic obstructive pulmonary disease, unspecified: Secondary | ICD-10-CM | POA: Diagnosis not present

## 2021-11-06 ENCOUNTER — Ambulatory Visit: Payer: Medicare Other | Admitting: Dermatology

## 2021-11-06 DIAGNOSIS — L409 Psoriasis, unspecified: Secondary | ICD-10-CM | POA: Diagnosis not present

## 2021-11-06 DIAGNOSIS — L408 Other psoriasis: Secondary | ICD-10-CM

## 2021-11-06 DIAGNOSIS — L304 Erythema intertrigo: Secondary | ICD-10-CM

## 2021-11-06 DIAGNOSIS — L403 Pustulosis palmaris et plantaris: Secondary | ICD-10-CM | POA: Diagnosis not present

## 2021-11-06 NOTE — Progress Notes (Signed)
? ?  Follow-Up Visit ?  ?Subjective  ?Abigail Aguirre is a 76 y.o. female who presents for the following: Psoriasis (Patient here today for psoriasis follow up at the palms. Patient currently on Sotyktu and using [place] and Zoryve. Patient advises the Clyda Hurdle is working well and psoriasis was improved until last night. She has not been taking the Sotyktu as prescribed because her husband was ill and she was concerned about her immune system. She had been taking the pills for 1-2 weeks before stopping them and has just restarted them yesterday, so she was off of them for a couple of months. ). ? ?Patient also with rash at low abdomen, inner thighs and has used ketoconazole 2% cream as well as over the counter products with no improvement. ? ?The following portions of the chart were reviewed this encounter and updated as appropriate:  ? Tobacco  Allergies  Meds  Problems  Med Hx  Surg Hx  Fam Hx   ?  ?Review of Systems:  No other skin or systemic complaints except as noted in HPI or Assessment and Plan. ? ?Objective  ?Well appearing patient in no apparent distress; mood and affect are within normal limits. ? ?A focused examination was performed including hands, abdomen. Relevant physical exam findings are noted in the Assessment and Plan. ? ?palms ?Hyperkeratosis  ? ? ? ? ? ? ?lower abdomen ?Maceration with erythema ? ? 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 Condition is bothersome/symptomatic for patient. Currently flared due to pt not taking medication. ? ?Psoriasis is a chronic non-curable, but treatable genetic/hereditary disease that may have other systemic features affecting other organ systems such as joints (Psoriatic Arthritis). It is associated with an increased risk of inflammatory bowel disease, heart disease, non-alcoholic fatty liver disease,  and depression.   ? ?Continue Sotyktu 6 mg once daily ?Continue Vtama once daily PRN ?Continue Zoryve once daily PRN ? ?Will order CBC w/diff and Lipid panel at follow up.  ? ?Related Medications ?Deucravacitinib (SOTYKTU) 6 MG TABS ?Take 1 tablet by mouth daily. ? ?Erythema intertrigo with Psoriasis Inversa ?lower abdomen ? ?Intertrigo is a chronic recurrent rash that occurs in skin fold areas that may be associated with friction; heat; moisture; yeast; fungus; and bacteria.  It is exacerbated by increased movement / activity; sweating; and higher atmospheric temperature. ? ?Start [facility] intertrigo daily ? ?Return for 5-6 weeks. ? ?Graciella Belton, RMA, am acting as scribe for Sarina Ser, MD . ?Documentation: I have reviewed the above documentation for accuracy and completeness, and I agree with the above. ? ?Sarina Ser, MD ? ?

## 2021-11-06 NOTE — Patient Instructions (Addendum)
Instructions for [facility] Medications ? ?One or more of your medications was sent to the [facility] mail order compounding pharmacy. You will receive an email from them and can purchase the medicine through that link. It will then be mailed to your home at the address you confirmed. If for any reason you do not receive an email from them, please check your spam folder. If you still do not find the email, please let us know. [facility] phone number is [phone]. ? ?Zeasorb AF powder ? ? ?If You Need Anything After Your Visit ? ?If you have any questions or concerns for your doctor, please call our main line at [phone] and press option 4 to reach your doctor's medical assistant. If no one answers, please leave a voicemail as directed and we will return your call as soon as possible. Messages left after 4 pm will be answered the following business day.  ? ?You may also send [place] a message via MyChart. We typically respond to MyChart messages within 1-2 business days. ? ?For prescription refills, please ask your pharmacy to contact our office. Our fax number is [phone]. ? ?If you have an urgent issue when the clinic is closed that cannot wait until the next business day, you can page your doctor at the number below.   ? ?Please note that while we do our best to be available for urgent issues outside of office hours, we are not available 24/7.  ? ?If you have an urgent issue and are unable to reach [place], you may choose to seek medical care at your doctor's office, retail clinic, urgent care center, or emergency room. ? ?If you have a medical emergency, please immediately call 911 or go to the emergency department. ? ?Pager Numbers ? ?- Dr. Nehemiah Massed: [phone] ? ?- Dr. Laurence Ferrari: [phone] ? ?- Dr. Nicole Kindred: [phone] ? ?In the event of inclement weather, please call our main line at [phone] for an update on the status of any delays or closures. ? ?Dermatology Medication Tips: ?Please  keep the boxes that topical medications come in in order to help keep track of the instructions about where and how to use these. Pharmacies typically print the medication instructions only on the boxes and not directly on the medication tubes.  ? ?If your medication is too expensive, please contact our office at [phone] option 4 or send [place] a message through [place].  ? ?We are unable to tell what your co-pay for medications will be in advance as this is different depending on your insurance coverage. However, we may be able to find a substitute medication at lower cost or fill out paperwork to get insurance to cover a needed medication.  ? ?If a prior authorization is required to get your medication covered by your insurance company, please allow [place] 1-2 business days to complete this process. ? ?Drug prices often vary depending on where the prescription is filled and some pharmacies may offer cheaper prices. ? ?The website www.goodrx.com contains coupons for medications through different pharmacies. The prices here do not account for what the cost may be with help from insurance (it may be cheaper with your insurance), but the website can give you the price if you did not use any insurance.  ?- You can print the associated coupon and take it with your prescription to the pharmacy.  ?- You may also stop by our office during regular business hours and pick up a GoodRx coupon card.  ?- If you  need your prescription sent electronically to a different pharmacy, notify our office through [facility] or by phone at [phone] option 4. ? ? ? ? ?Si Usted Necesita Algo Despu?s de Su Visita ? ?Tambi?n puede enviarnos un mensaje a trav?s de MyChart. Por lo general respondemos a los mensajes de MyChart en el transcurso de 1 a 2 d?as h?biles. ? ?Para renovar recetas, por favor pida a su farmacia que se ponga en contacto con nuestra oficina. Nuestro n?mero de fax es el [phone]. ? ?Si tiene un asunto urgente  cuando la cl?nica est? cerrada y que no puede esperar hasta el siguiente d?a h?bil, puede llamar/localizar a su doctor(a) al n?mero que aparece a continuaci?n.  ? ?Por favor, tenga en cuenta que aunque hacemos todo lo posible para estar disponibles para asuntos urgentes fuera del horario de oficina, no estamos disponibles las 24 horas del d?a, los 7 d?as de la semana.  ? ?Si tiene un problema urgente y no puede comunicarse con nosotros, puede optar por buscar atenci?n m?dica  en el consultorio de su doctor(a), en una cl?nica privada, en un centro de atenci?n urgente o en una sala de emergencias. ? ?Si tiene Engineer, maintenance (IT) m?dica, por favor llame inmediatamente al 911 o vaya a la sala de emergencias. ? ?N?meros de b?per ? ?- Dr. Nehemiah Massed: [phone] ? ?- Dra. Moye: [phone] ? ?- Dra. Nicole Kindred: [phone] ? ?En caso de inclemencias del tiempo, por favor llame a nuestra l?nea principal al [phone] para una actualizaci?n sobre el estado de cualquier retraso o cierre. ? ?Consejos para la medicaci?n en dermatolog?a: ?Por favor, guarde las cajas en las que vienen los medicamentos de uso t?pico para ayudarle a seguir las instrucciones sobre d?nde y c?mo usarlos. Las farmacias generalmente imprimen las instrucciones del medicamento s?lo en las cajas y no directamente en los tubos del [place].  ? ?Si su medicamento es muy caro, por favor, p?ngase en contacto con Zigmund Daniel llamando al [phone] y presione la opci?n 4 o env?enos un mensaje a trav?s de MyChart.  ? ?No podemos decirle cu?l ser? su copago por los medicamentos por adelantado ya que esto es diferente dependiendo de la cobertura de su seguro. Sin embargo, es posible que podamos encontrar un medicamento sustituto a Electrical engineer un formulario para que el seguro cubra el medicamento que se considera necesario.  ? ?Si se requiere Ardelia Mems autorizaci?n previa para que su compa??a de seguros [place] su medicamento, por favor perm?tanos de 1 a 2  d?as h?biles para completar este proceso. ? ?Los precios de los medicamentos var?an con frecuencia dependiendo del Environmental consultant de d?nde se surte la receta y alguna farmacias pueden ofrecer precios m?s baratos. ? ?El sitio web www.goodrx.com tiene cupones para medicamentos de Airline pilot. Los precios aqu? no tienen en cuenta lo que podr?a costar con la ayuda del seguro (puede ser m?s barato con su seguro), pero el sitio web puede darle el precio si no utiliz? ning?n seguro.  ?- Puede imprimir el cup?n correspondiente y llevarlo con su receta a la farmacia.  ?- Tambi?n puede pasar por nuestra oficina durante el horario de atenci?n regular y recoger una tarjeta de cupones de GoodRx.  ?- Si necesita que su receta se env?e electr?nicamente a Chiropodist, informe a nuestra oficina a trav?s de MyChart de [place] o por tel?fono llamando al [phone] y presione la opci?n 4. ? ?

## 2021-11-07 DIAGNOSIS — I10 Essential (primary) hypertension: Secondary | ICD-10-CM | POA: Diagnosis not present

## 2021-11-07 DIAGNOSIS — K219 Gastro-esophageal reflux disease without esophagitis: Secondary | ICD-10-CM | POA: Diagnosis not present

## 2021-11-07 DIAGNOSIS — M810 Age-related osteoporosis without current pathological fracture: Secondary | ICD-10-CM | POA: Diagnosis not present

## 2021-11-07 DIAGNOSIS — J449 Chronic obstructive pulmonary disease, unspecified: Secondary | ICD-10-CM | POA: Diagnosis not present

## 2021-11-07 DIAGNOSIS — E785 Hyperlipidemia, unspecified: Secondary | ICD-10-CM | POA: Diagnosis not present

## 2021-11-12 ENCOUNTER — Encounter: Payer: Self-pay | Admitting: Dermatology

## 2021-12-12 ENCOUNTER — Ambulatory Visit: Payer: Medicare Other | Admitting: Dermatology

## 2021-12-12 DIAGNOSIS — L400 Psoriasis vulgaris: Secondary | ICD-10-CM | POA: Diagnosis not present

## 2021-12-12 DIAGNOSIS — L304 Erythema intertrigo: Secondary | ICD-10-CM | POA: Diagnosis not present

## 2021-12-12 NOTE — Progress Notes (Signed)
   Follow-Up Visit   Subjective  Abigail Aguirre is a 76 y.o. female who presents for the following: Psoriasis (Psoriasis (Patient here today for psoriasis follow up at the palms. Patient currently on Sotyktu and using Vtama and Zoryve cream with a poor response. Patient report itchy is a big factor in her life /Patient going to [facility] June 23 to have work up of labs and other health conditions. Vennie Homans treatments include R[place] Nail which patient stopped due to GI issues,/Has tried and failed [employer], [employer], [place] , Pharmacist, community /). Patient tried and failed Duobrii cream, Betamethasone cream, Calcipotriene cream. Halobetasol cream, Tacrolimus cream.   Patch testing of 36 products was done in Feb 2022 True patch testing showed allergy to Mercaptobenzothiazole  The patient scratches at the hand vigorously as she says that it itches constantly.  She may not have been compliant with some of her past prescription medications.  The following portions of the chart were reviewed this encounter and updated as appropriate:   Tobacco  Allergies  Meds  Problems  Med Hx  Surg Hx  Fam Hx     Review of Systems:  No other skin or systemic complaints except as noted in HPI or Assessment and Plan.  Objective  Well appearing patient in no apparent distress; mood and affect are within normal limits.  A focused examination was performed including hands, arms,abdomen. Relevant physical exam findings are noted in the Assessment and Plan.  right palm of hand Well-demarcated erythematous papules/plaques with silvery scale, guttate pink scaly papules.          lower abdomen Mild erythema improving    Assessment & Plan  Plaque psoriasis with severe pruritus right palm of hand  Psoriasis with Palmar Psoriasis and Psoriasis inversa and Intertrigo/with possible hand dermatitis and eczema /atopic dermatitis overlap Palms; abdomen Chronic and persistent condition with  duration or expected duration over one year. Condition is bothersome/symptomatic for patient. Currently flared due to pt not taking medication.   Psoriasis is a chronic non-curable, but treatable genetic/hereditary disease that may have other systemic features affecting other organ systems such as joints (Psoriatic Arthritis). It is associated with an increased risk of inflammatory bowel disease, heart disease, non-alcoholic fatty liver disease, and depression.    D/C Sotyktu  Cont Vtama cream once or twice a day Cont Zoryve cream once or twice a day   May consider Rinvoq in the future as should help with itch She wants to wait until after her evaluation at the [facility] on June 23 before she tries anything else.  Intertrigo lower abdomen Intertrigo is a chronic recurrent rash that occurs in skin fold areas that may be associated with friction; heat; moisture; yeast; fungus; and bacteria.  It is exacerbated by increased movement / activity; sweating; and higher atmospheric temperature.   Continue skin medicinals intertrigo cream   Return in about 2 months (around 02/11/2022) for Psoriasis/Rash .  IMarye Round, CMA, am acting as scribe for Sarina Ser, MD .  Documentation: I have reviewed the above documentation for accuracy and completeness, and I agree with the above.  Sarina Ser, MD

## 2021-12-12 NOTE — Patient Instructions (Signed)

## 2021-12-22 ENCOUNTER — Encounter: Payer: Self-pay | Admitting: Dermatology

## 2022-01-10 DIAGNOSIS — Z86010 Personal history of colonic polyps: Secondary | ICD-10-CM | POA: Diagnosis not present

## 2022-01-10 DIAGNOSIS — I700 Atherosclerosis of aorta: Secondary | ICD-10-CM | POA: Diagnosis not present

## 2022-01-10 DIAGNOSIS — E785 Hyperlipidemia, unspecified: Secondary | ICD-10-CM | POA: Diagnosis not present

## 2022-01-10 DIAGNOSIS — M810 Age-related osteoporosis without current pathological fracture: Secondary | ICD-10-CM | POA: Diagnosis not present

## 2022-01-10 DIAGNOSIS — K580 Irritable bowel syndrome with diarrhea: Secondary | ICD-10-CM | POA: Diagnosis not present

## 2022-01-10 DIAGNOSIS — I10 Essential (primary) hypertension: Secondary | ICD-10-CM | POA: Diagnosis not present

## 2022-01-10 DIAGNOSIS — J449 Chronic obstructive pulmonary disease, unspecified: Secondary | ICD-10-CM | POA: Diagnosis not present

## 2022-01-10 DIAGNOSIS — Z0000 Encounter for general adult medical examination without abnormal findings: Secondary | ICD-10-CM | POA: Diagnosis not present

## 2022-01-10 DIAGNOSIS — K219 Gastro-esophageal reflux disease without esophagitis: Secondary | ICD-10-CM | POA: Diagnosis not present

## 2022-01-10 DIAGNOSIS — F17200 Nicotine dependence, unspecified, uncomplicated: Secondary | ICD-10-CM | POA: Diagnosis not present

## 2022-01-18 DIAGNOSIS — L409 Psoriasis, unspecified: Secondary | ICD-10-CM | POA: Diagnosis not present

## 2022-01-18 DIAGNOSIS — R5383 Other fatigue: Secondary | ICD-10-CM | POA: Diagnosis not present

## 2022-01-18 DIAGNOSIS — Z1329 Encounter for screening for other suspected endocrine disorder: Secondary | ICD-10-CM | POA: Diagnosis not present

## 2022-01-18 DIAGNOSIS — K589 Irritable bowel syndrome without diarrhea: Secondary | ICD-10-CM | POA: Diagnosis not present

## 2022-01-18 DIAGNOSIS — K4191 Unilateral femoral hernia, without obstruction or gangrene, recurrent: Secondary | ICD-10-CM | POA: Diagnosis not present

## 2022-01-18 DIAGNOSIS — B372 Candidiasis of skin and nail: Secondary | ICD-10-CM | POA: Diagnosis not present

## 2022-03-21 ENCOUNTER — Ambulatory Visit: Payer: Medicare Other | Admitting: Dermatology

## 2022-03-21 DIAGNOSIS — L299 Pruritus, unspecified: Secondary | ICD-10-CM | POA: Diagnosis not present

## 2022-03-21 DIAGNOSIS — L408 Other psoriasis: Secondary | ICD-10-CM | POA: Diagnosis not present

## 2022-03-21 DIAGNOSIS — L409 Psoriasis, unspecified: Secondary | ICD-10-CM | POA: Diagnosis not present

## 2022-03-21 DIAGNOSIS — L304 Erythema intertrigo: Secondary | ICD-10-CM

## 2022-03-21 DIAGNOSIS — L2081 Atopic neurodermatitis: Secondary | ICD-10-CM

## 2022-03-21 DIAGNOSIS — Z79899 Other long term (current) drug therapy: Secondary | ICD-10-CM | POA: Diagnosis not present

## 2022-03-21 NOTE — Patient Instructions (Signed)
Due to recent changes in healthcare laws, you may see results of your pathology and/or laboratory studies on MyChart before the doctors have had a chance to review them. We understand that in some cases there may be results that are confusing or concerning to you. Please understand that not all results are received at the same time and often the doctors may need to interpret multiple results in order to provide you with the best plan of care or course of treatment. Therefore, we ask that you please give us 2 business days to thoroughly review all your results before contacting the office for clarification. Should we see a critical lab result, you will be contacted sooner.   If You Need Anything After Your Visit  If you have any questions or concerns for your doctor, please call our main line at [phone] and press option 4 to reach your doctor's medical assistant. If no one answers, please leave a voicemail as directed and we will return your call as soon as possible. Messages left after 4 pm will be answered the following business day.   You may also send us a message via MyChart. We typically respond to MyChart messages within 1-2 business days.  For prescription refills, please ask your pharmacy to contact our office. Our fax number is [phone].  If you have an urgent issue when the clinic is closed that cannot wait until the next business day, you can page your doctor at the number below.    Please note that while we do our best to be available for urgent issues outside of office hours, we are not available 24/7.   If you have an urgent issue and are unable to reach us, you may choose to seek medical care at your doctor's office, retail clinic, urgent care center, or emergency room.  If you have a medical emergency, please immediately call 911 or go to the emergency department.  Pager Numbers  - Dr. Kowalski: [phone]  - Dr. Moye: [phone]  - Dr. Stewart:  [phone]  In the event of inclement weather, please call our main line at [phone] for an update on the status of any delays or closures.  Dermatology Medication Tips: Please keep the boxes that topical medications come in in order to help keep track of the instructions about where and how to use these. Pharmacies typically print the medication instructions only on the boxes and not directly on the medication tubes.   If your medication is too expensive, please contact our office at [phone] option 4 or send us a message through MyChart.   We are unable to tell what your co-pay for medications will be in advance as this is different depending on your insurance coverage. However, we may be able to find a substitute medication at lower cost or fill out paperwork to get insurance to cover a needed medication.   If a prior authorization is required to get your medication covered by your insurance company, please allow us 1-2 business days to complete this process.  Drug prices often vary depending on where the prescription is filled and some pharmacies may offer cheaper prices.  The website www.goodrx.com contains coupons for medications through different pharmacies. The prices here do not account for what the cost may be with help from insurance (it may be cheaper with your insurance), but the website can give you the price if you did not use any insurance.  - You can print the associated coupon and take it with   your prescription to the pharmacy.  - You may also stop by our office during regular business hours and pick up a GoodRx coupon card.  - If you need your prescription sent electronically to a different pharmacy, notify our office through [facility] MyChart or by phone at [phone] option 4.     Si Usted Necesita Algo Despus de Su Visita  Tambin puede enviarnos un mensaje a travs de MyChart. Por lo general respondemos a los mensajes de MyChart en el transcurso de 1 a 2  das hbiles.  Para renovar recetas, por favor pida a su farmacia que se ponga en contacto con nuestra oficina. Nuestro nmero de fax es el [phone].  Si tiene un asunto urgente cuando la clnica est cerrada y que no puede esperar hasta el siguiente da hbil, puede llamar/localizar a su doctor(a) al nmero que aparece a continuacin.   Por favor, tenga en cuenta que aunque hacemos todo lo posible para estar disponibles para asuntos urgentes fuera del horario de oficina, no estamos disponibles las 24 horas del da, los 7 das de la semana.   Si tiene un problema urgente y no puede comunicarse con nosotros, puede optar por buscar atencin mdica  en el consultorio de su doctor(a), en una clnica privada, en un centro de atencin urgente o en una sala de emergencias.  Si tiene una emergencia mdica, por favor llame inmediatamente al 911 o vaya a la sala de emergencias.  Nmeros de bper  - Dr. Kowalski: [phone]  - Dra. Moye: [phone]  - Dra. Stewart: [phone]  En caso de inclemencias del tiempo, por favor llame a nuestra lnea principal al [phone] para una actualizacin sobre el estado de cualquier retraso o cierre.  Consejos para la medicacin en dermatologa: Por favor, guarde las cajas en las que vienen los medicamentos de uso tpico para ayudarle a seguir las instrucciones sobre dnde y cmo usarlos. Las farmacias generalmente imprimen las instrucciones del medicamento slo en las cajas y no directamente en los tubos del medicamento.   Si su medicamento es muy caro, por favor, pngase en contacto con nuestra oficina llamando al [phone] y presione la opcin 4 o envenos un mensaje a travs de MyChart.   No podemos decirle cul ser su copago por los medicamentos por adelantado ya que esto es diferente dependiendo de la cobertura de su seguro. Sin embargo, es posible que podamos encontrar un medicamento sustituto a menor costo o llenar un formulario para que el  seguro cubra el medicamento que se considera necesario.   Si se requiere una autorizacin previa para que su compaa de seguros cubra su medicamento, por favor permtanos de 1 a 2 das hbiles para completar este proceso.  Los precios de los medicamentos varan con frecuencia dependiendo del lugar de dnde se surte la receta y alguna farmacias pueden ofrecer precios ms baratos.  El sitio web www.goodrx.com tiene cupones para medicamentos de diferentes farmacias. Los precios aqu no tienen en cuenta lo que podra costar con la ayuda del seguro (puede ser ms barato con su seguro), pero el sitio web puede darle el precio si no utiliz ningn seguro.  - Puede imprimir el cupn correspondiente y llevarlo con su receta a la farmacia.  - Tambin puede pasar por nuestra oficina durante el horario de atencin regular y recoger una tarjeta de cupones de GoodRx.  - Si necesita que su receta se enve electrnicamente a una farmacia diferente, informe a nuestra oficina a travs de MyChart de [facility]   o por telfono llamando al [phone] y presione la opcin 4.  

## 2022-03-21 NOTE — Progress Notes (Signed)
Follow-Up Visit   Subjective  Abigail Aguirre is a 76 y.o. female who presents for the following:  Severe pruritus with features of Psoriasis Of the palms especially the R hand - patient has restarted the Sotyktu since seeing "[facility]".  Pt has used Vtama and Zoryve cream with a poor response. Patient report itchy is a big factor in her life /Patient went to [facility] June 23 to have work up of labs and other health conditions.  Past treatments include Rutherford Nail which patient stopped due to GI issues,/Has tried and failed [employer], [employer], [place] , Pharmacist, community /). Patient tried and failed Duobrii cream, Betamethasone cream, Calcipotriene cream. Halobetasol cream, Tacrolimus cream; Vtama and Zoryve topicals.   Patch testing of 36 products was done in Feb 2022 True patch testing showed allergy to Mercaptobenzothiazole.  The patient scratches at the hand vigorously as she says that it itches constantly.  She may not have been compliant with some of her past prescription medications.  The following portions of the chart were reviewed this encounter and updated as appropriate:   Tobacco  Allergies  Meds  Problems  Med Hx  Surg Hx  Fam Hx      Review of Systems:  No other skin or systemic complaints except as noted in HPI or Assessment and Plan.  Objective  Well appearing patient in no apparent distress; mood and affect are within normal limits.  A focused examination was performed including the face and hands. Relevant physical exam findings are noted in the Assessment and Plan.  R hand        Assessment & Plan  Severe Palmar Pruritus with features of Psoriasis Resulting in scratching that leaves excoriations and ulcers on the palms R hand; L hand  Psoriasis with Palmar Psoriasis and Psoriasis inversa and Intertrigo/with possible hand dermatitis and eczema /atopic dermatitis overlap.   Chronic and persistent condition with  duration or expected duration over one year. Condition is bothersome/symptomatic for patient. Currently flared.   The patient has failed multiple past systemic treatments including oral Otezla; injectable Cosentyx; Skyrizi; Stelara; and oral Sotyktu.  She has also failed multiple topical treatments including Duobrii cream; betamethasone cream; calcipotriene cream; halobetasol cream; tacrolimus cream; Vtama and Zoryve topicals  Recent oral surgery and medical conditions have caused stress and psoriasis to flare.  Reviewed labs from December 2022 and all OK - No TB testing done.   Psoriasis is a chronic non-curable, but treatable genetic/hereditary disease that may have other systemic features affecting other organ systems such as joints (Psoriatic Arthritis). It is associated with an increased risk of inflammatory bowel disease, heart disease, non-alcoholic fatty liver disease, and depression.    D/C Sotyktu   Continue Vtama and Zoryve cream to aa's QD.   Pending labs start Rinvoq '15mg'$  po QD.   Related Procedures QuantiFERON-TB Gold Plus  Related Medications Deucravacitinib (SOTYKTU) 6 MG TABS Take 1 tablet by mouth daily.  Erythema intertrigo Lower abdomen Intertrigo is a chronic recurrent rash that occurs in skin fold areas that may be associated with friction; heat; moisture; yeast; fungus; and bacteria.  It is exacerbated by increased movement / activity; sweating; and higher atmospheric temperature.  Continue Nystatin powder daily.   Return for psoriasis follow up in 5-6 weeks.  Luther Redo, CMA, am acting as scribe for Sarina Ser, MD . Documentation: I have reviewed the above documentation for accuracy and completeness, and I agree with the above.  Sarina Ser,  MD

## 2022-03-23 ENCOUNTER — Encounter: Payer: Self-pay | Admitting: Dermatology

## 2022-03-26 ENCOUNTER — Telehealth: Payer: Self-pay

## 2022-03-26 LAB — QUANTIFERON-TB GOLD PLUS
QuantiFERON Mitogen Value: >10 IU/mL
QuantiFERON Nil Value: 0.11 IU/mL
QuantiFERON TB1 Ag Value: 0.12 IU/mL
QuantiFERON TB2 Ag Value: 0.12 IU/mL
QuantiFERON-TB Gold Plus: NEGATIVE

## 2022-03-26 NOTE — Telephone Encounter (Signed)
Advised pt of lab results.  Advised pt that I would leave 3 boxes of samples of Rinvoq '15mg'$  for her to take 1 po qd until she returns for 05/02/22 appointment.  Lot [id number] exp 12/22/23/sh

## 2022-03-29 ENCOUNTER — Ambulatory Visit
Admission: RE | Admit: 2022-03-29 | Discharge: 2022-03-29 | Disposition: A | Discharge status: Home / Self Care | Payer: Medicare Other | Source: Ambulatory Visit | Attending: Acute Care | Admitting: Acute Care

## 2022-03-29 DIAGNOSIS — J439 Emphysema, unspecified: Secondary | ICD-10-CM | POA: Diagnosis not present

## 2022-03-29 DIAGNOSIS — J948 Other specified pleural conditions: Secondary | ICD-10-CM | POA: Diagnosis not present

## 2022-03-29 DIAGNOSIS — F17210 Nicotine dependence, cigarettes, uncomplicated: Secondary | ICD-10-CM

## 2022-03-29 DIAGNOSIS — Z87891 Personal history of nicotine dependence: Secondary | ICD-10-CM | POA: Diagnosis not present

## 2022-03-29 DIAGNOSIS — I2510 Atherosclerotic heart disease of native coronary artery without angina pectoris: Secondary | ICD-10-CM | POA: Diagnosis not present

## 2022-04-02 ENCOUNTER — Other Ambulatory Visit: Payer: Self-pay | Admitting: Acute Care

## 2022-04-02 DIAGNOSIS — Z87891 Personal history of nicotine dependence: Secondary | ICD-10-CM

## 2022-04-02 DIAGNOSIS — Z122 Encounter for screening for malignant neoplasm of respiratory organs: Secondary | ICD-10-CM

## 2022-04-02 DIAGNOSIS — F17210 Nicotine dependence, cigarettes, uncomplicated: Secondary | ICD-10-CM

## 2022-04-12 DIAGNOSIS — L409 Psoriasis, unspecified: Secondary | ICD-10-CM | POA: Diagnosis not present

## 2022-04-12 DIAGNOSIS — M810 Age-related osteoporosis without current pathological fracture: Secondary | ICD-10-CM | POA: Diagnosis not present

## 2022-04-12 DIAGNOSIS — Z1159 Encounter for screening for other viral diseases: Secondary | ICD-10-CM | POA: Diagnosis not present

## 2022-04-12 DIAGNOSIS — I10 Essential (primary) hypertension: Secondary | ICD-10-CM | POA: Diagnosis not present

## 2022-04-12 DIAGNOSIS — E785 Hyperlipidemia, unspecified: Secondary | ICD-10-CM | POA: Diagnosis not present

## 2022-05-02 ENCOUNTER — Ambulatory Visit: Payer: Medicare Other | Admitting: Dermatology

## 2022-05-02 DIAGNOSIS — L304 Erythema intertrigo: Secondary | ICD-10-CM

## 2022-05-02 DIAGNOSIS — Z79899 Other long term (current) drug therapy: Secondary | ICD-10-CM

## 2022-05-02 DIAGNOSIS — L409 Psoriasis, unspecified: Secondary | ICD-10-CM

## 2022-05-02 NOTE — Progress Notes (Signed)
Follow-Up Visit   Subjective  Abigail Aguirre is a 76 y.o. female who presents for the following: Psoriasis (Hx of plaque psoriasis at hands, tried different treatments and cream ). Patient reports she has only taken a few pills of Rinvoq .   Patient stopped all medication for all her medical conditions recently and is currently taking nothing including Rinvoq. Patients she stopped all medication due to dental procedure. Patient reports she has seen improvement at her hands but still has some new flares forming.  The following portions of the chart were reviewed this encounter and updated as appropriate:  Tobacco  Allergies  Meds  Problems  Med Hx  Surg Hx  Fam Hx     Review of Systems: No other skin or systemic complaints except as noted in HPI or Assessment and Plan.  Objective  Well appearing patient in no apparent distress; mood and affect are within normal limits.  A focused examination was performed including upper extremities, including the arms, hands, fingers, and fingernails. Relevant physical exam findings are noted in the Assessment and Plan.   Assessment & Plan  Psoriasis Pt has not been compliant with any of the treatments she has been prescribed.  She has seen Dr Laurence Ferrari in past too and is reluctant to use medications, and when she does she discontinues quickly before meds even have time to work. It appears she is much improved today which may be from the few Rinvoq tablets she has taken, but again has already discontinued them. I decided to avoid urging her to start Rinvoq again, rather I encouraged her to continue her "drug holiday" if her PCP feels it is safe for her to be off her other medications.  She is advised to re-start her Rinvoq when her hands get worse if she is so inclined.  She is encouraged to stay on Rinvoq after starting -- until her next appt. b/l hands Severe Palmar Pruritus with features of Psoriasis Resulting in scratching that leaves  excoriations and ulcers on the palms R hand; L hand Psoriasis with Palmar Psoriasis and Psoriasis inversa and Intertrigo/with possible hand dermatitis and eczema /atopic dermatitis overlap.    Chronic and persistent condition with duration or expected duration over one year. Condition is bothersome/symptomatic for patient. Not to goal.   The patient has failed multiple past systemic treatments including oral Otezla; injectable Cosentyx; Skyrizi; Regulatory affairs officer; and oral Sotyktu.  She has also failed multiple topical treatments including Duobrii cream; betamethasone cream; calcipotriene cream; halobetasol cream; tacrolimus cream; Vtama and Zoryve topicals   Recent oral surgery and medical conditions have caused stress and psoriasis to flare.  Reviewed labs from December 2022 and all OK -  TB testing done 03/21/22 = Negative.    Psoriasis is a chronic non-curable, but treatable genetic/hereditary disease that may have other systemic features affecting other organ systems such as joints (Psoriatic Arthritis). It is associated with an increased risk of inflammatory bowel disease, heart disease, non-alcoholic fatty liver disease, and depression.      Continue Vtama and Zoryve cream to aa's QD.  Continue Rinvoq '15mg'$  po QD when she is ready to re-start - especially if hands flare.  Erythema intertrigo Right Abdomen (side) - Lower Lower abdomen Intertrigo is a chronic recurrent rash that occurs in skin fold areas that may be associated with friction; heat; moisture; yeast; fungus; and bacteria.  It is exacerbated by increased movement / activity; sweating; and higher atmospheric temperature.   Continue Nystatin powder daily.  Return for psoriasis follow up in 5-6 weeks.  Return in about 3 weeks (around 05/23/2022) for psoriasis follow up. IRuthell Rummage, CMA, am acting as scribe for Sarina Ser, MD. Documentation: I have reviewed the above documentation for accuracy and completeness, and I agree  with the above.  Sarina Ser, MD

## 2022-05-02 NOTE — Patient Instructions (Addendum)
Stop all medications will recheck in 3 weeks     Due to recent changes in healthcare laws, you may see results of your pathology and/or laboratory studies on MyChart before the doctors have had a chance to review them. We understand that in some cases there may be results that are confusing or concerning to you. Please understand that not all results are received at the same time and often the doctors may need to interpret multiple results in order to provide you with the best plan of care or course of treatment. Therefore, we ask that you please give [place] 2 business days to thoroughly review all your results before contacting the office for clarification. Should we see a critical lab result, you will be contacted sooner.   If You Need Anything After Your Visit  If you have any questions or concerns for your doctor, please call our main line at [phone] and press option 4 to reach your doctor's medical assistant. If no one answers, please leave a voicemail as directed and we will return your call as soon as possible. Messages left after 4 pm will be answered the following business day.   You may also send [place] a message via [place]. We typically respond to MyChart messages within 1-2 business days.  For prescription refills, please ask your pharmacy to contact our office. Our fax number is [phone].  If you have an urgent issue when the clinic is closed that cannot wait until the next business day, you can page your doctor at the number below.    Please note that while we do our best to be available for urgent issues outside of office hours, we are not available 24/7.   If you have an urgent issue and are unable to reach [place], you may choose to seek medical care at your doctor's office, retail clinic, urgent care center, or emergency room.  If you have a medical emergency, please immediately call 911 or go to the emergency department.  Pager Numbers  - Dr. Nehemiah Massed: [phone]  - Dr.  Laurence Ferrari: [phone]  - Dr. Nicole Kindred: [phone]  In the event of inclement weather, please call our main line at [phone] for an update on the status of any delays or closures.  Dermatology Medication Tips: Please keep the boxes that topical medications come in in order to help keep track of the instructions about where and how to use these. Pharmacies typically print the medication instructions only on the boxes and not directly on the medication tubes.   If your medication is too expensive, please contact our office at [phone] option 4 or send [place] a message through [place].   We are unable to tell what your co-pay for medications will be in advance as this is different depending on your insurance coverage. However, we may be able to find a substitute medication at lower cost or fill out paperwork to get insurance to cover a needed medication.   If a prior authorization is required to get your medication covered by your insurance company, please allow [place] 1-2 business days to complete this process.  Drug prices often vary depending on where the prescription is filled and some pharmacies may offer cheaper prices.  The website www.goodrx.com contains coupons for medications through different pharmacies. The prices here do not account for what the cost may be with help from insurance (it may be cheaper with your insurance), but the website can give you the price if you did not use any insurance.  -  You can print the associated coupon and take it with your prescription to the pharmacy.  - You may also stop by our office during regular business hours and pick up a GoodRx coupon card.  - If you need your prescription sent electronically to a different pharmacy, notify our office through [facility] or by phone at [phone] option 4.     Si Usted Necesita Algo Despus de Su Visita  Tambin puede enviarnos un mensaje a travs de Pharmacist, community. Por lo general respondemos a los mensajes  de MyChart en el transcurso de 1 a 2 das hbiles.  Para renovar recetas, por favor pida a su farmacia que se ponga en contacto con nuestra oficina. Harland Dingwall de fax es [place] [phone].  Si tiene un asunto urgente cuando la clnica est cerrada y que no puede esperar hasta el siguiente da hbil, puede llamar/localizar a su doctor(a) al nmero que aparece a continuacin.   Por favor, tenga en cuenta que aunque hacemos todo lo posible para estar disponibles para asuntos urgentes fuera del horario de [place], no estamos disponibles las 24 horas del da, los 7 das de la [place].   Si tiene un problema urgente y no puede comunicarse con nosotros, puede optar por buscar atencin mdica  en el consultorio de su doctor(a), en una clnica privada, en un centro de atencin urgente o en una sala de emergencias.  Si tiene Engineering geologist, por favor llame inmediatamente al 911 o vaya a la sala de emergencias.  Nmeros de bper  - Dr. Nehemiah Massed: [phone]  - Dra. Moye: [phone]  - Dra. Nicole Kindred: [phone]  En caso de inclemencias del [place], por favor llame a Johnsie Kindred principal al [phone] para una actualizacin sobre el [place] de cualquier retraso o cierre.  Consejos para la medicacin en dermatologa: Por favor, guarde las cajas en las que vienen los medicamentos de uso tpico para ayudarle a seguir las instrucciones sobre dnde y cmo usarlos. Las farmacias generalmente imprimen las instrucciones del medicamento slo en las cajas y no directamente en los tubos del [place].   Si su medicamento es muy caro, por favor, pngase en contacto con Zigmund Daniel llamando al [phone] y presione la opcin 4 o envenos un mensaje a travs de Pharmacist, community.   No podemos decirle cul ser su copago por los medicamentos por adelantado ya que esto es diferente dependiendo de la cobertura de su seguro. Sin embargo, es posible que podamos encontrar un medicamento sustituto a Actor un formulario para que el seguro cubra el medicamento que se considera necesario.   Si se requiere una autorizacin previa para que su compaa de seguros [place] su medicamento, por favor permtanos de 1 a 2 das hbiles para completar este proceso.  Los precios de los medicamentos varan con frecuencia dependiendo del Environmental consultant de dnde se surte la receta y alguna farmacias pueden ofrecer precios ms baratos.  El sitio web www.goodrx.com tiene cupones para medicamentos de Airline pilot. Los precios aqu no tienen en cuenta lo que podra costar con la ayuda del seguro (puede ser ms barato con su seguro), pero el sitio web puede darle el precio si no utiliz Research scientist (physical sciences).  - Puede imprimir el cupn correspondiente y llevarlo con su receta a la farmacia.  - Tambin puede pasar por nuestra oficina durante el horario de atencin regular y Charity fundraiser una tarjeta de cupones de GoodRx.  - Si necesita que su receta se enve electrnicamente a una farmacia diferente, informe  a nuestra oficina a travs de MyChart de [place] o por telfono llamando al [phone] y presione la opcin 4.

## 2022-05-07 ENCOUNTER — Encounter: Payer: Self-pay | Admitting: Dermatology

## 2022-05-23 ENCOUNTER — Ambulatory Visit: Payer: Medicare Other | Admitting: Dermatology

## 2022-05-23 DIAGNOSIS — L304 Erythema intertrigo: Secondary | ICD-10-CM | POA: Diagnosis not present

## 2022-05-23 DIAGNOSIS — L299 Pruritus, unspecified: Secondary | ICD-10-CM

## 2022-05-23 DIAGNOSIS — L409 Psoriasis, unspecified: Secondary | ICD-10-CM | POA: Diagnosis not present

## 2022-05-23 DIAGNOSIS — L408 Other psoriasis: Secondary | ICD-10-CM

## 2022-05-23 DIAGNOSIS — Z79899 Other long term (current) drug therapy: Secondary | ICD-10-CM

## 2022-05-23 NOTE — Progress Notes (Signed)
   Follow-Up Visit   Subjective  Abigail Aguirre is a 76 y.o. female who presents for the following: Psoriasis (Psoriasis and Inverse psoriasis/intertrigo of hands and groin area - stopped Rinvoq prior to last appointment and has not restarted it yet. She has failed multiple systemic and topical treatment. She uses nystatin for groin area and it seems to help. She has restarted her lisnopril and vitamin b12 and she does not feel like they have made her condition worse. She has a raging itch even when she was off all of medications).  The following portions of the chart were reviewed this encounter and updated as appropriate:   Tobacco  Allergies  Meds  Problems  Med Hx  Surg Hx  Fam Hx     Review of Systems:  No other skin or systemic complaints except as noted in HPI or Assessment and Plan.  Objective  Well appearing patient in no apparent distress; mood and affect are within normal limits.  A focused examination was performed including hands. Relevant physical exam findings are noted in the Assessment and Plan.  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    Chronic and persistent condition with duration or expected duration over one year. Condition is bothersome/symptomatic for patient. Not to goal.  The patient has failed multiple past systemic treatments including oral Otezla; injectable Cosentyx; Skyrizi; Regulatory affairs officer; and oral Sotyktu.  She has also failed multiple topical treatments including Duobrii cream; betamethasone cream; calcipotriene cream; halobetasol cream; tacrolimus cream; Vtama and Zoryve topicals  The patient has been very noncompliant with her recent treatment.  She states she will stay on her Rinvoq treatment until follow up 3 weeks so  we can assess whether helping or not  treatment but psoriasis is a chronic non-curable, but treatable genetic/hereditary disease that may have other systemic features affecting other organ systems such as joints (Psoriatic Arthritis). It is associated with an increased risk of inflammatory bowel disease, heart disease, non-alcoholic fatty liver disease, and depression.    Restart Rinvoq 15 mg 1 po qd - advised patient to take every day until her follow up appointment.  Erythema intertrigo Continue Nystatin qd to groin area  Return in about 3 weeks (around 06/13/2022) for Psoriasis.  I, Ashok Cordia, CMA, am acting as scribe for Sarina Ser, MD . Documentation: I have reviewed the above documentation for accuracy and completeness, and I agree with the above.  Sarina Ser, MD

## 2022-05-23 NOTE — Patient Instructions (Signed)
Due to recent changes in healthcare laws, you may see results of your pathology and/or laboratory studies on MyChart before the doctors have had a chance to review them. We understand that in some cases there may be results that are confusing or concerning to you. Please understand that not all results are received at the same time and often the doctors may need to interpret multiple results in order to provide you with the best plan of care or course of treatment. Therefore, we ask that you please give us 2 business days to thoroughly review all your results before contacting the office for clarification. Should we see a critical lab result, you will be contacted sooner.   If You Need Anything After Your Visit  If you have any questions or concerns for your doctor, please call our main line at [phone] and press option 4 to reach your doctor's medical assistant. If no one answers, please leave a voicemail as directed and we will return your call as soon as possible. Messages left after 4 pm will be answered the following business day.   You may also send us a message via MyChart. We typically respond to MyChart messages within 1-2 business days.  For prescription refills, please ask your pharmacy to contact our office. Our fax number is [phone].  If you have an urgent issue when the clinic is closed that cannot wait until the next business day, you can page your doctor at the number below.    Please note that while we do our best to be available for urgent issues outside of office hours, we are not available 24/7.   If you have an urgent issue and are unable to reach us, you may choose to seek medical care at your doctor's office, retail clinic, urgent care center, or emergency room.  If you have a medical emergency, please immediately call 911 or go to the emergency department.  Pager Numbers  - Dr. Kowalski: [phone]  - Dr. Moye: [phone]  - Dr. Stewart:  [phone]  In the event of inclement weather, please call our main line at [phone] for an update on the status of any delays or closures.  Dermatology Medication Tips: Please keep the boxes that topical medications come in in order to help keep track of the instructions about where and how to use these. Pharmacies typically print the medication instructions only on the boxes and not directly on the medication tubes.   If your medication is too expensive, please contact our office at [phone] option 4 or send us a message through MyChart.   We are unable to tell what your co-pay for medications will be in advance as this is different depending on your insurance coverage. However, we may be able to find a substitute medication at lower cost or fill out paperwork to get insurance to cover a needed medication.   If a prior authorization is required to get your medication covered by your insurance company, please allow us 1-2 business days to complete this process.  Drug prices often vary depending on where the prescription is filled and some pharmacies may offer cheaper prices.  The website www.goodrx.com contains coupons for medications through different pharmacies. The prices here do not account for what the cost may be with help from insurance (it may be cheaper with your insurance), but the website can give you the price if you did not use any insurance.  - You can print the associated coupon and take it with   your prescription to the pharmacy.  - You may also stop by our office during regular business hours and pick up a GoodRx coupon card.  - If you need your prescription sent electronically to a different pharmacy, notify our office through [place] MyChart or by phone at [phone] option 4.     Si Usted Necesita Algo Despus de Su Visita  Tambin puede enviarnos un mensaje a travs de MyChart. Por lo general respondemos a los mensajes de MyChart en el transcurso de 1 a 2  das hbiles.  Para renovar recetas, por favor pida a su farmacia que se ponga en contacto con nuestra oficina. Nuestro nmero de fax es el [phone].  Si tiene un asunto urgente cuando la clnica est cerrada y que no puede esperar hasta el siguiente da hbil, puede llamar/localizar a su doctor(a) al nmero que aparece a continuacin.   Por favor, tenga en cuenta que aunque hacemos todo lo posible para estar disponibles para asuntos urgentes fuera del horario de oficina, no estamos disponibles las 24 horas del da, los 7 das de la semana.   Si tiene un problema urgente y no puede comunicarse con nosotros, puede optar por buscar atencin mdica  en el consultorio de su doctor(a), en una clnica privada, en un centro de atencin urgente o en una sala de emergencias.  Si tiene una emergencia mdica, por favor llame inmediatamente al 911 o vaya a la sala de emergencias.  Nmeros de bper  - Dr. Kowalski: [phone]  - Dra. Moye: [phone]  - Dra. Stewart: [phone]  En caso de inclemencias del tiempo, por favor llame a nuestra lnea principal al [phone] para una actualizacin sobre el estado de cualquier retraso o cierre.  Consejos para la medicacin en dermatologa: Por favor, guarde las cajas en las que vienen los medicamentos de uso tpico para ayudarle a seguir las instrucciones sobre dnde y cmo usarlos. Las farmacias generalmente imprimen las instrucciones del medicamento slo en las cajas y no directamente en los tubos del medicamento.   Si su medicamento es muy caro, por favor, pngase en contacto con nuestra oficina llamando al [phone] y presione la opcin 4 o envenos un mensaje a travs de MyChart.   No podemos decirle cul ser su copago por los medicamentos por adelantado ya que esto es diferente dependiendo de la cobertura de su seguro. Sin embargo, es posible que podamos encontrar un medicamento sustituto a menor costo o llenar un formulario para que el  seguro cubra el medicamento que se considera necesario.   Si se requiere una autorizacin previa para que su compaa de seguros cubra su medicamento, por favor permtanos de 1 a 2 das hbiles para completar este proceso.  Los precios de los medicamentos varan con frecuencia dependiendo del lugar de dnde se surte la receta y alguna farmacias pueden ofrecer precios ms baratos.  El sitio web www.goodrx.com tiene cupones para medicamentos de diferentes farmacias. Los precios aqu no tienen en cuenta lo que podra costar con la ayuda del seguro (puede ser ms barato con su seguro), pero el sitio web puede darle el precio si no utiliz ningn seguro.  - Puede imprimir el cupn correspondiente y llevarlo con su receta a la farmacia.  - Tambin puede pasar por nuestra oficina durante el horario de atencin regular y recoger una tarjeta de cupones de GoodRx.  - Si necesita que su receta se enve electrnicamente a una farmacia diferente, informe a nuestra oficina a travs de MyChart de [place]   o por telfono llamando al [phone] y presione la opcin 4.  

## 2022-05-27 ENCOUNTER — Encounter: Payer: Self-pay | Admitting: Dermatology

## 2022-06-13 ENCOUNTER — Ambulatory Visit: Payer: Medicare Other | Admitting: Dermatology

## 2022-06-13 DIAGNOSIS — Z79899 Other long term (current) drug therapy: Secondary | ICD-10-CM

## 2022-06-13 DIAGNOSIS — L2089 Other atopic dermatitis: Secondary | ICD-10-CM | POA: Diagnosis not present

## 2022-06-13 DIAGNOSIS — L409 Psoriasis, unspecified: Secondary | ICD-10-CM

## 2022-06-13 DIAGNOSIS — L304 Erythema intertrigo: Secondary | ICD-10-CM

## 2022-06-13 MED ORDER — RINVOQ 15 MG PO TB24: 1.0000 "application " | ORAL_TABLET | Freq: Every day | ORAL | 3 refills | Status: DC

## 2022-06-13 NOTE — Patient Instructions (Addendum)
Due to recent changes in healthcare laws, you may see results of your pathology and/or laboratory studies on MyChart before the doctors have had a chance to review them. We understand that in some cases there may be results that are confusing or concerning to you. Please understand that not all results are received at the same time and often the doctors may need to interpret multiple results in order to provide you with the best plan of care or course of treatment. Therefore, we ask that you please give us 2 business days to thoroughly review all your results before contacting the office for clarification. Should we see a critical lab result, you will be contacted sooner.   If You Need Anything After Your Visit  If you have any questions or concerns for your doctor, please call our main line at [phone] and press option 4 to reach your doctor's medical assistant. If no one answers, please leave a voicemail as directed and we will return your call as soon as possible. Messages left after 4 pm will be answered the following business day.   You may also send us a message via MyChart. We typically respond to MyChart messages within 1-2 business days.  For prescription refills, please ask your pharmacy to contact our office. Our fax number is [phone].  If you have an urgent issue when the clinic is closed that cannot wait until the next business day, you can page your doctor at the number below.    Please note that while we do our best to be available for urgent issues outside of office hours, we are not available 24/7.   If you have an urgent issue and are unable to reach us, you may choose to seek medical care at your doctor's office, retail clinic, urgent care center, or emergency room.  If you have a medical emergency, please immediately call 911 or go to the emergency department.  Pager Numbers  - Dr. Kowalski: [phone]  - Dr. Moye: [phone]  - Dr. Stewart:  [phone]  In the event of inclement weather, please call our main line at [phone] for an update on the status of any delays or closures.  Dermatology Medication Tips: Please keep the boxes that topical medications come in in order to help keep track of the instructions about where and how to use these. Pharmacies typically print the medication instructions only on the boxes and not directly on the medication tubes.   If your medication is too expensive, please contact our office at [phone] option 4 or send us a message through MyChart.   We are unable to tell what your co-pay for medications will be in advance as this is different depending on your insurance coverage. However, we may be able to find a substitute medication at lower cost or fill out paperwork to get insurance to cover a needed medication.   If a prior authorization is required to get your medication covered by your insurance company, please allow us 1-2 business days to complete this process.  Drug prices often vary depending on where the prescription is filled and some pharmacies may offer cheaper prices.  The website www.goodrx.com contains coupons for medications through different pharmacies. The prices here do not account for what the cost may be with help from insurance (it may be cheaper with your insurance), but the website can give you the price if you did not use any insurance.  - You can print the associated coupon and take it with   your prescription to the pharmacy.  - You may also stop by our office during regular business hours and pick up a GoodRx coupon card.  - If you need your prescription sent electronically to a different pharmacy, notify our office through [place] MyChart or by phone at [phone] option 4.     Si Usted Necesita Algo Despus de Su Visita  Tambin puede enviarnos un mensaje a travs de MyChart. Por lo general respondemos a los mensajes de MyChart en el transcurso de 1 a 2  das hbiles.  Para renovar recetas, por favor pida a su farmacia que se ponga en contacto con nuestra oficina. Nuestro nmero de fax es el [phone].  Si tiene un asunto urgente cuando la clnica est cerrada y que no puede esperar hasta el siguiente da hbil, puede llamar/localizar a su doctor(a) al nmero que aparece a continuacin.   Por favor, tenga en cuenta que aunque hacemos todo lo posible para estar disponibles para asuntos urgentes fuera del horario de oficina, no estamos disponibles las 24 horas del da, los 7 das de la semana.   Si tiene un problema urgente y no puede comunicarse con nosotros, puede optar por buscar atencin mdica  en el consultorio de su doctor(a), en una clnica privada, en un centro de atencin urgente o en una sala de emergencias.  Si tiene una emergencia mdica, por favor llame inmediatamente al 911 o vaya a la sala de emergencias.  Nmeros de bper  - Dr. Kowalski: [phone]  - Dra. Moye: [phone]  - Dra. Stewart: [phone]  En caso de inclemencias del tiempo, por favor llame a nuestra lnea principal al [phone] para una actualizacin sobre el estado de cualquier retraso o cierre.  Consejos para la medicacin en dermatologa: Por favor, guarde las cajas en las que vienen los medicamentos de uso tpico para ayudarle a seguir las instrucciones sobre dnde y cmo usarlos. Las farmacias generalmente imprimen las instrucciones del medicamento slo en las cajas y no directamente en los tubos del medicamento.   Si su medicamento es muy caro, por favor, pngase en contacto con nuestra oficina llamando al [phone] y presione la opcin 4 o envenos un mensaje a travs de MyChart.   No podemos decirle cul ser su copago por los medicamentos por adelantado ya que esto es diferente dependiendo de la cobertura de su seguro. Sin embargo, es posible que podamos encontrar un medicamento sustituto a menor costo o llenar un formulario para que el  seguro cubra el medicamento que se considera necesario.   Si se requiere una autorizacin previa para que su compaa de seguros cubra su medicamento, por favor permtanos de 1 a 2 das hbiles para completar este proceso.  Los precios de los medicamentos varan con frecuencia dependiendo del lugar de dnde se surte la receta y alguna farmacias pueden ofrecer precios ms baratos.  El sitio web www.goodrx.com tiene cupones para medicamentos de diferentes farmacias. Los precios aqu no tienen en cuenta lo que podra costar con la ayuda del seguro (puede ser ms barato con su seguro), pero el sitio web puede darle el precio si no utiliz ningn seguro.  - Puede imprimir el cupn correspondiente y llevarlo con su receta a la farmacia.  - Tambin puede pasar por nuestra oficina durante el horario de atencin regular y recoger una tarjeta de cupones de GoodRx.  - Si necesita que su receta se enve electrnicamente a una farmacia diferente, informe a nuestra oficina a travs de MyChart de [place]   o por telfono llamando al [phone] y presione la opcin 4.  

## 2022-06-13 NOTE — Progress Notes (Signed)
   Follow-Up Visit   Subjective  Abigail Aguirre is a 76 y.o. female who presents for the following: Psoriasis (3 week follow up - Rinvoq - she has not taken for a week due to a cold).  The following portions of the chart were reviewed this encounter and updated as appropriate:   Tobacco  Allergies  Meds  Problems  Med Hx  Surg Hx  Fam Hx     Review of Systems:  No other skin or systemic complaints except as noted in HPI or Assessment and Plan.  Objective  Well appearing patient in no apparent distress; mood and affect are within normal limits.  A focused examination was performed including hands. Relevant physical exam findings are noted in the Assessment and Plan.  Hands           Assessment & Plan  Atopic Dermatitis with Severe Pruritus Hands Much improved on oral Rinvoq See photos hand dermatitis and eczema /atopic dermatitis With Psoriasis with Palmar Psoriasis and Psoriasis inversa Overlap.    Chronic and persistent condition with duration or expected duration over one year. Condition is bothersome/symptomatic for patient. Not to goal.   The patient has failed multiple past systemic treatments including oral Otezla; injectable Cosentyx; Skyrizi; Regulatory affairs officer; and oral Sotyktu.  She has also failed multiple topical treatments including Duobrii cream; betamethasone cream; calcipotriene cream; halobetasol cream; tacrolimus cream; Vtama and Zoryve topicals   The patient was more compliant with this treatment, staying on medication for 2 weeks before stopping due to a cold.  She states she would like to continue Rinvoq treatment so we will submit a prescription to [facility]. 28 days of samples given today.  Upadacitinib ER (RINVOQ) 15 MG TB24 - Hands Take 1 application  by mouth daily.  Erythema intertrigo Groin area Continue Nystatin as prescribed  Return in about 3 months (around 09/13/2022) for Psoriasis.  I, Ashok Cordia, CMA, am acting as scribe for  Sarina Ser, MD . Documentation: I have reviewed the above documentation for accuracy and completeness, and I agree with the above.  Sarina Ser, MD

## 2022-07-02 ENCOUNTER — Encounter: Payer: Self-pay | Admitting: Dermatology

## 2022-07-02 DIAGNOSIS — L409 Psoriasis, unspecified: Secondary | ICD-10-CM | POA: Diagnosis not present

## 2022-07-02 DIAGNOSIS — I10 Essential (primary) hypertension: Secondary | ICD-10-CM | POA: Diagnosis not present

## 2022-07-02 DIAGNOSIS — E785 Hyperlipidemia, unspecified: Secondary | ICD-10-CM | POA: Diagnosis not present

## 2022-07-08 DIAGNOSIS — F17200 Nicotine dependence, unspecified, uncomplicated: Secondary | ICD-10-CM | POA: Diagnosis not present

## 2022-07-08 DIAGNOSIS — E785 Hyperlipidemia, unspecified: Secondary | ICD-10-CM | POA: Diagnosis not present

## 2022-07-08 DIAGNOSIS — I700 Atherosclerosis of aorta: Secondary | ICD-10-CM | POA: Diagnosis not present

## 2022-07-08 DIAGNOSIS — I10 Essential (primary) hypertension: Secondary | ICD-10-CM | POA: Diagnosis not present

## 2022-07-08 DIAGNOSIS — L409 Psoriasis, unspecified: Secondary | ICD-10-CM | POA: Diagnosis not present

## 2022-07-08 DIAGNOSIS — J449 Chronic obstructive pulmonary disease, unspecified: Secondary | ICD-10-CM | POA: Diagnosis not present

## 2022-07-08 DIAGNOSIS — Z23 Encounter for immunization: Secondary | ICD-10-CM | POA: Diagnosis not present

## 2022-07-18 ENCOUNTER — Telehealth: Payer: Self-pay

## 2022-07-18 NOTE — Telephone Encounter (Signed)
They are denying patients Rinvoq due to patient not having 10% BSA involvement and she has not tried and failed a minimum 12 week supply of at least one systemic drug product for the treatment of Atopic Dermatitis (examples include but are not limited to Adbry/dupixent)

## 2022-07-25 NOTE — Telephone Encounter (Signed)
Called patient and left VM to discuss information with patient. aw

## 2022-09-19 ENCOUNTER — Ambulatory Visit: Payer: Medicare Other | Admitting: Dermatology

## 2022-10-08 DIAGNOSIS — E559 Vitamin D deficiency, unspecified: Secondary | ICD-10-CM | POA: Diagnosis not present

## 2022-10-08 DIAGNOSIS — J449 Chronic obstructive pulmonary disease, unspecified: Secondary | ICD-10-CM | POA: Diagnosis not present

## 2022-10-08 DIAGNOSIS — L409 Psoriasis, unspecified: Secondary | ICD-10-CM | POA: Diagnosis not present

## 2022-10-08 DIAGNOSIS — I10 Essential (primary) hypertension: Secondary | ICD-10-CM | POA: Diagnosis not present

## 2022-10-08 DIAGNOSIS — I700 Atherosclerosis of aorta: Secondary | ICD-10-CM | POA: Diagnosis not present

## 2022-10-08 DIAGNOSIS — F17200 Nicotine dependence, unspecified, uncomplicated: Secondary | ICD-10-CM | POA: Diagnosis not present

## 2022-10-08 DIAGNOSIS — E785 Hyperlipidemia, unspecified: Secondary | ICD-10-CM | POA: Diagnosis not present

## 2022-11-04 DIAGNOSIS — L409 Psoriasis, unspecified: Secondary | ICD-10-CM | POA: Diagnosis not present

## 2022-11-11 DIAGNOSIS — I700 Atherosclerosis of aorta: Secondary | ICD-10-CM | POA: Diagnosis not present

## 2022-11-11 DIAGNOSIS — K580 Irritable bowel syndrome with diarrhea: Secondary | ICD-10-CM | POA: Diagnosis not present

## 2022-11-11 DIAGNOSIS — G4710 Hypersomnia, unspecified: Secondary | ICD-10-CM | POA: Diagnosis not present

## 2022-11-11 DIAGNOSIS — I10 Essential (primary) hypertension: Secondary | ICD-10-CM | POA: Diagnosis not present

## 2022-11-11 DIAGNOSIS — H6692 Otitis media, unspecified, left ear: Secondary | ICD-10-CM | POA: Diagnosis not present

## 2022-11-11 DIAGNOSIS — J449 Chronic obstructive pulmonary disease, unspecified: Secondary | ICD-10-CM | POA: Diagnosis not present

## 2022-11-11 DIAGNOSIS — F17200 Nicotine dependence, unspecified, uncomplicated: Secondary | ICD-10-CM | POA: Diagnosis not present

## 2022-11-12 ENCOUNTER — Encounter: Payer: Self-pay | Admitting: Physician Assistant

## 2022-12-16 DIAGNOSIS — D171 Benign lipomatous neoplasm of skin and subcutaneous tissue of trunk: Secondary | ICD-10-CM | POA: Diagnosis not present

## 2022-12-16 DIAGNOSIS — L409 Psoriasis, unspecified: Secondary | ICD-10-CM | POA: Diagnosis not present

## 2022-12-16 DIAGNOSIS — L304 Erythema intertrigo: Secondary | ICD-10-CM | POA: Diagnosis not present

## 2023-01-07 NOTE — Progress Notes (Unsigned)
01/08/2023 Abigail Aguirre [id number] [birth year]  Referring provider: Laurann Montana, MD Primary GI doctor: Dr. Lavon Paganini  ASSESSMENT AND PLAN:   77 year old female with history of COPD, continues to smoke, GERD with hiatal hernia, psoriasis on Rinvoq, longstanding history of reflux and GI issues presents with multitude of GI complaints with associated weight loss. On exam patient does have some left lower quadrant tenderness with rebound, previous CT abdomen pelvis 2018 with diverticulitis without complication, possible chills Monday. Will proceed with CT abdomen pelvis with contrast, labs with CBC, c-Met, sed rate to evaluate for diverticulitis, complications, pancreas, malignancy with weight loss. Will get C. difficile with recent antibiotics for ear infection, pancreatic elastase and fecal fat with history being worse after food  GERD with dysphagia and patient that continues to smoke, not on PPI, on Aleve occasionally Barium swallow 2021 showed moderate esophageal dysmotility, hiatal hernia, GERD, no masses or strictures Advised patient stop NSAIDs, quit smoking Added on Prilosec 40 mg once daily Will repeat barium swallow to evaluate for stricture, masses, reflux. No endoscopic evaluation planned at this time and less barium swallow shows stenosis/malignancy Early satiety, given gastroparesis diet, if everything's unremarkable can consider GES  Personal history of adenomatous colon polyps Last colonoscopy 2018 with Dr. Myrtie Cruise 1 adenomatous polyp no recall secondary to age Check labs, CT abdomen/pelvis, with weight loss can consider repeat endoscopic evaluation EGD/colon Close follow-up 4 to 6 weeks   Patient Care Team: Charlane Ferretti, DO as PCP - General (Internal Medicine) Jake Bathe, MD as PCP - Cardiology (Cardiology)  HISTORY OF PRESENT ILLNESS: 77 y.o. female with a past medical history of anxiety, depression, hypertension, hyperlipidemia, COPD  with emphysema, GERD with hiatal hernia, personal history of tubular adenomatous polyps, psoriasis and others listed below presents for evaluation of diarrhea weight loss.   10/08/2022 labs in [place] showed normal electrolytes, BUN 15, creatinine 0.75, alk phos 77, AST 13, ALT 8, albumin 4.3, total bilirubin 0.5, TSH 1.2, WBC 9.4, Hgb 14.4, MCV 94.2, platelets 212 Patient is on Rinvoq for psoriasis  09/02/2016 CT AB and pelvis diverticulitis sigmoid colon 09/20/2016 colonoscopy Dr. Myrtie Cruise [facility] 1 tubular adenoma and 1 hyperplastic polyp was removed, recommended recall colonoscopy in 5 years March 2023 10/24/2016 EGD LA grade B reflux esophagitis, small hiatal hernia and gastritis.  02/28/2020 office visit with Dr. Lavon Paganini as a new patient for evaluation of dysphagia, referred to pelvic floor physical therapy 03/10/2020 barium swallow showed small to moderate sliding-type hiatal hernia, nonspecific esophageal motility disorder and reflux, no stricture or mass.  MBS normal with normal oropharyngeal function no aspiration.  Weight over last 3 years is down 16 lbs.  She has had GI symptoms for 60 years but feels this is getting worse. She states she has decreased appetite, gets full very quickly, mainly drinking fluids. She can not eat much.  She has GERD at times, she is on her husband's omeprazole as needed, once a month. She continues to have dysphagia, noticed worse recently in last 3 months. Consistently there, occ liquids/foods, at times worse than others.  She has had diarrhea that comes and goes, 3-4 x a week will have loose stools, not daily. States can be worse after food, can be worse with stress. Has had recent ABX x 2 for ear infection.  Will occ not have a BM every day, can have formed stool.  She is not on iron or pepto, has occ dark stools but denies melena, hematochezia.  She  has had lower Ab discomfort.  Monday had chills, felt tingling right arm, and fatigue. Feeling better now  other than weakness.    Wt Readings from Last 3 Encounters:  01/08/23 115 lb 4 oz (52.3 kg)  03/13/20 131 lb (59.4 kg)  02/28/20 131 lb 8 oz (59.6 kg)     She denies blood thinner use.  She reports NSAID use, she is on aleve PRN, can be twice a week.  She denies ETOH use.   She reports tobacco use.  She denies drug use.    She  reports that she has been smoking cigarettes. She has a 40.00 pack-year smoking history. She has never used smokeless tobacco. She reports that she does not drink alcohol and does not use drugs.  RELEVANT LABS AND IMAGING: CBC    Component Value Date/Time   WBC 9.3 07/03/2021 1434   WBC 9.5 08/10/2016 1831   RBC 4.49 07/03/2021 1434   RBC 4.54 08/10/2016 1831   HGB 14.0 07/03/2021 1434   HCT 41.5 07/03/2021 1434   PLT 202 07/03/2021 1434   MCV 92 07/03/2021 1434   MCH 31.2 07/03/2021 1434   MCH 31.7 08/10/2016 1831   MCHC 33.7 07/03/2021 1434   MCHC 34.0 08/10/2016 1831   RDW 12.5 07/03/2021 1434   LYMPHSABS 3.3 (H) 07/03/2021 1434   MONOABS 1.0 08/10/2016 1831   EOSABS 0.6 (H) 07/03/2021 1434   BASOSABS 0.1 07/03/2021 1434   No results for input(s): "HGB" in the last 8760 hours.  CMP     Component Value Date/Time   NA 147 (H) 07/03/2021 1434   K 4.6 07/03/2021 1434   CL 108 (H) 07/03/2021 1434   CO2 26 07/03/2021 1434   GLUCOSE 86 07/03/2021 1434   GLUCOSE 108 (H) 08/10/2016 1830   BUN 9 07/03/2021 1434   CREATININE 0.78 07/03/2021 1434   CALCIUM 9.6 07/03/2021 1434   PROT 6.8 07/03/2021 1434   ALBUMIN 4.1 07/03/2021 1434   AST 13 07/03/2021 1434   ALT 11 07/03/2021 1434   ALKPHOS 83 07/03/2021 1434   BILITOT <0.2 07/03/2021 1434   GFRNONAA >60 08/10/2016 1830   GFRAA >60 08/10/2016 1830      Latest Ref Rng & Units 07/03/2021    2:34 PM 11/22/2011    4:58 PM  Hepatic Function  Total Protein 6.0 - 8.5 g/dL 6.8  7.3   Albumin 3.7 - 4.7 g/dL 4.1  4.3   AST 0 - 40 IU/L 13  14   ALT 0 - 32 IU/L 11  12   Alk Phosphatase 44 - 121  IU/L 83  87   Total Bilirubin 0.0 - 1.2 mg/dL <4.0  0.2       Current Medications:    Current Outpatient Medications (Cardiovascular):    lisinopril (PRINIVIL,ZESTRIL) 20 MG tablet, Take 20 mg by mouth every morning.  Current Outpatient Medications (Respiratory):    cetirizine (ZYRTEC) 10 MG tablet, Take 10 mg by mouth daily.    Current Outpatient Medications (Other):    AMBULATORY NON FORMULARY MEDICATION, Methylation Complete 1 daily B12, Folate, B6, P5P   clobetasol ointment (TEMOVATE) 0.05 %, Apply 1 Application topically.   MAGNESIUM GLYCINATE PO, Take 1 capsule by mouth daily.   Multiple Minerals-Vitamins (NUTRA-SUPPORT BONE PO), Take 1 capsule by mouth daily.   Multiple Vitamin (MULTIVITAMIN) capsule, Take 1 capsule by mouth daily.   omeprazole (PRILOSEC) 40 MG capsule, Take 1 capsule (40 mg total) by mouth daily before breakfast.   Probiotic  Product (PROBIOTIC DAILY PO), Take 1 capsule by mouth daily.  Medical History:  Past Medical History:  Diagnosis Date   Anxiety    Atrial fibrillation (HCC)    Depression    Diverticulosis    Emphysema lung (HCC)    mild   GERD (gastroesophageal reflux disease)    Hiatal hernia    HLD (hyperlipidemia)    Hypertension    IBS (irritable bowel syndrome)    Tubular adenoma of colon 10/24/2016   Allergies:  Allergies  Allergen Reactions   Desvenlafaxine     Other Reaction(s): dizzy   Duloxetine     Other Reaction(s): dizzy   Fluoxetine     Other Reaction(s): numb, Unknown   Penicillin G     Other Reaction(s): abdominal pain   Quetiapine     Other Reaction(s): dizzy, Unknown   Venlafaxine     Other Reaction(s): shaky, knot in stomach   Mercaptobenzothiazole Rash   Wellbutrin [Bupropion Hcl] Rash     Surgical History:  She  has a past surgical history that includes Tonsillectomy; Partial hysterectomy; and Tubal ligation. Family History:  Her family history includes Dementia in her mother; Hashimoto's thyroiditis in  her son; Hearing loss in her sister; Heart attack in her father; Heart disease in her paternal grandfather; Hypertension in her mother and son; Lung cancer in her paternal grandfather; Other in her father; Stroke in her father.  REVIEW OF SYSTEMS  : All other systems reviewed and negative except where noted in the History of Present Illness.  PHYSICAL EXAM: BP (!) 150/60 (BP Location: Left Arm, Patient Position: Sitting, Cuff Size: Normal)   Pulse 76 Comment: irregular  Ht 4\' 9"  (1.448 m)   Wt 115 lb 4 oz (52.3 kg)   BMI 24.94 kg/m  General Appearance: Well nourished, in no apparent distress. Head:   Normocephalic and atraumatic. Eyes:  sclerae anicteric,conjunctive pink  Respiratory: Respiratory effort normal, BS equal bilaterally without rales, rhonchi, wheezing. Cardio: Irregular irregular or regular pulse, possible trigeminy, with no MRGs. Peripheral pulses intact.  Abdomen: Soft,  Non-distended ,active bowel sounds. mild tenderness in the LLQ. With guarding and With rebound. No masses. Rectal: Not evaluated Musculoskeletal: Full ROM, Normal gait. Without edema. Skin:  Dry and intact without significant lesions or rashes Neuro: Alert and  oriented x4;  No focal deficits. Psych:  Cooperative. Normal mood and affect.    Doree Albee, PA-C 12:16 PM

## 2023-01-08 ENCOUNTER — Encounter: Payer: Self-pay | Admitting: Physician Assistant

## 2023-01-08 ENCOUNTER — Ambulatory Visit: Payer: Medicare Other | Admitting: Physician Assistant

## 2023-01-08 ENCOUNTER — Other Ambulatory Visit (INDEPENDENT_AMBULATORY_CARE_PROVIDER_SITE_OTHER): Payer: Medicare Other

## 2023-01-08 VITALS — BP 150/60 | HR 76 | Ht <= 58 in | Wt 115.2 lb

## 2023-01-08 DIAGNOSIS — Z86010 Personal history of colonic polyps: Secondary | ICD-10-CM

## 2023-01-08 DIAGNOSIS — R634 Abnormal weight loss: Secondary | ICD-10-CM | POA: Diagnosis not present

## 2023-01-08 DIAGNOSIS — E876 Hypokalemia: Secondary | ICD-10-CM

## 2023-01-08 DIAGNOSIS — R197 Diarrhea, unspecified: Secondary | ICD-10-CM

## 2023-01-08 DIAGNOSIS — K5731 Diverticulosis of large intestine without perforation or abscess with bleeding: Secondary | ICD-10-CM

## 2023-01-08 DIAGNOSIS — R1310 Dysphagia, unspecified: Secondary | ICD-10-CM | POA: Diagnosis not present

## 2023-01-08 LAB — COMPREHENSIVE METABOLIC PANEL
ALT: 44 U/L — ABNORMAL HIGH (ref 0–35)
AST: 59 U/L — ABNORMAL HIGH (ref 0–37)
Albumin: 4.1 g/dL (ref 3.5–5.2)
Alkaline Phosphatase: 99 U/L (ref 39–117)
BUN: 12 mg/dL (ref 6–23)
CO2: 29 mEq/L (ref 19–32)
Calcium: 9.5 mg/dL (ref 8.4–10.5)
Chloride: 100 mEq/L (ref 96–112)
Creatinine, Ser: 1 mg/dL (ref 0.40–1.20)
GFR: 53.99 mL/min — ABNORMAL LOW (ref >60.00)
Glucose, Bld: 111 mg/dL — ABNORMAL HIGH (ref 70–99)
Potassium: 3.1 mEq/L — ABNORMAL LOW (ref 3.5–5.1)
Sodium: 139 mEq/L (ref 135–145)
Total Bilirubin: 0.5 mg/dL (ref 0.2–1.2)
Total Protein: 7.2 g/dL (ref 6.0–8.3)

## 2023-01-08 LAB — CBC WITH DIFFERENTIAL/PLATELET
Basophils Absolute: 0 10*3/uL (ref 0.0–0.1)
Basophils Relative: 0.5 % (ref 0.0–3.0)
Eosinophils Absolute: 0.6 10*3/uL (ref 0.0–0.7)
Eosinophils Relative: 6.8 % — ABNORMAL HIGH (ref 0.0–5.0)
HCT: 43.8 % (ref 36.0–46.0)
Hemoglobin: 14.6 g/dL (ref 12.0–15.0)
Lymphocytes Relative: 16.1 % (ref 12.0–46.0)
Lymphs Abs: 1.3 10*3/uL (ref 0.7–4.0)
MCHC: 33.3 g/dL (ref 30.0–36.0)
MCV: 94.3 fl (ref 78.0–100.0)
Monocytes Absolute: 1.2 10*3/uL — ABNORMAL HIGH (ref 0.1–1.0)
Monocytes Relative: 14.1 % — ABNORMAL HIGH (ref 3.0–12.0)
Neutro Abs: 5.2 10*3/uL (ref 1.4–7.7)
Neutrophils Relative %: 62.5 % (ref 43.0–77.0)
Platelets: 134 10*3/uL — ABNORMAL LOW (ref 150.0–400.0)
RBC: 4.64 Mil/uL (ref 3.87–5.11)
RDW: 13.8 % (ref 11.5–15.5)
WBC: 8.3 10*3/uL (ref 4.0–10.5)

## 2023-01-08 LAB — TSH: TSH: 3.2 u[IU]/mL (ref 0.35–5.50)

## 2023-01-08 LAB — C-REACTIVE PROTEIN: CRP: 7.5 mg/dL (ref 0.5–20.0)

## 2023-01-08 LAB — SEDIMENTATION RATE: Sed Rate: 22 mm/hr (ref 0–30)

## 2023-01-08 MED ORDER — OMEPRAZOLE 40 MG PO CPDR
40.0000 mg | DELAYED_RELEASE_CAPSULE | Freq: Every day | ORAL | 1 refills | Status: DC
Start: 2023-01-08 — End: 2023-04-04

## 2023-01-08 NOTE — Patient Instructions (Signed)
Advised to go to the ER if there is any severe weakness, severe abdominal pain, vomit blood, dark red blood in your bowel movement, shortness of breath or chest pain.   Your provider has requested that you go to the basement level for lab work before leaving today. Press "B" on the elevator. The lab is located at the first door on the left as you exit the elevator.  Please take your proton pump inhibitor medication, start prilosec 40 mg once daily.   Please take this medication 30 minutes to 1 hour before meals- this makes it more effective.  Avoid spicy and acidic foods Avoid fatty foods Limit your intake of coffee, tea, alcohol, and carbonated drinks Work to maintain a healthy weight Keep the head of the bed elevated at least 3 inches with blocks or a wedge pillow if you are having any nighttime symptoms Stay upright for 2 hours after eating Avoid meals and snacks three to four hours before bedtime Stop smoking  You have been scheduled for a CT scan of the abdomen and pelvis at [facility], 1st floor Radiology. You are scheduled on 01/09/2023 at 3 pm.Arrive at 1 pm  nothing to eat or drink 4 hours before   You will be contacted by [facility] Scheduling in the next 2 days to arrange a Barium Swallow  The number on your caller ID will be [phone], please answer when they call.  If you have not heard from them in 2 days please call [phone] to schedule.    .    Gastroparesis Please do small frequent meals like 4-6 meals a day.  Eat and drink liquids at separate times.  Avoid high fiber foods, cook your vegetables, avoid high fat food.  Suggest spreading protein throughout the day (greek yogurt, glucerna, soft meat, milk, eggs) Choose soft foods that you can mash with a fork When you are more symptomatic, change to pureed foods foods and liquids.  Consider reading "Living well with Gastroparesis" by Reuel Derby Gastroparesis is a condition in which food  takes longer than normal to empty from the stomach. This condition is also known as delayed gastric emptying. It is usually a long-term (chronic) condition. There is no cure, but there are treatments and things that you can do at home to help relieve symptoms. Treating the underlying condition that causes gastroparesis can also help relieve symptoms What are the causes? In many cases, the cause of this condition is not known. Possible causes include: A hormone (endocrine) disorder, such as hypothyroidism or diabetes. A nervous system disease, such as Parkinson's disease or multiple sclerosis. Cancer, infection, or surgery that affects the stomach or vagus nerve. The vagus nerve runs from your chest, through your neck, and to the lower part of your brain. A connective tissue disorder, such as scleroderma. Certain medicines. What increases the risk? You are more likely to develop this condition if: You have certain disorders or diseases. These may include: An endocrine disorder. An eating disorder. Amyloidosis. Scleroderma. Parkinson's disease. Multiple sclerosis. Cancer or infection of the stomach or the vagus nerve. You have had surgery on your stomach or vagus nerve. You take certain medicines. You are female. What are the signs or symptoms? Symptoms of this condition include: Feeling full after eating very little or a loss of appetite. Nausea, vomiting, or heartburn. Bloating of your abdomen. Inconsistent blood sugar (glucose) levels on blood tests. Unexplained weight loss. Acid from the stomach coming up into the esophagus (gastroesophageal  reflux). Sudden tightening (spasm) of the stomach, which can be painful. Symptoms may come and go. Some people may not notice any symptoms. How is this diagnosed? This condition is diagnosed with tests, such as: Tests that check how long it takes food to move through the stomach and intestines. These tests include: Upper gastrointestinal (GI)  series. For this test, you drink a liquid that shows up well on X-rays, and then X-rays are taken of your intestines. Gastric emptying scintigraphy. For this test, you eat food that contains a small amount of radioactive material, and then scans are taken. Wireless capsule GI monitoring system. For this test, you swallow a pill (capsule) that records information about how foods and fluid move through your stomach. Gastric manometry. For this test, a tube is passed down your throat and into your stomach to measure electrical and muscular activity. Endoscopy. For this test, a long, thin tube with a camera and light on the end is passed down your throat and into your stomach to check for problems in your stomach lining. Ultrasound. This test uses sound waves to create images of the inside of your body. This can help rule out gallbladder disease or pancreatitis as a cause of your symptoms. How is this treated? There is no cure for this condition, but treatment and home care may relieve symptoms. Treatment may include: Treating the underlying cause. Managing your symptoms by making changes to your diet and exercise habits. Taking medicines to control nausea and vomiting and to stimulate stomach muscles. Getting food through a feeding tube in the hospital. This may be done in severe cases. Having surgery to insert a device called a gastric electrical stimulator into your body. This device helps improve stomach emptying and control nausea and vomiting. Follow these instructions at home: Take over-the-counter and prescription medicines only as told by your health care provider. Follow instructions from your health care provider about eating or drinking restrictions. Your health care provider may recommend that you: Eat smaller meals more often. Eat low-fat foods. Eat low-fiber forms of high-fiber foods. For example, eat cooked vegetables instead of raw vegetables. Have only liquid foods instead of solid  foods. Liquid foods are easier to digest. Drink enough fluid to keep your urine pale yellow. Exercise as often as told by your health care provider. Keep all follow-up visits. This is important. Contact a health care provider if you: Notice that your symptoms do not improve with treatment. Have new symptoms. Get help right away if you: Have severe pain in your abdomen that does not improve with treatment. Have nausea that is severe or does not go away. Vomit every time you drink fluids. Summary Gastroparesis is a long-term (chronic) condition in which food takes longer than normal to empty from the stomach. Symptoms include nausea, vomiting, heartburn, bloating of your abdomen, and loss of appetite. Eating smaller portions, low-fat foods, and low-fiber forms of high-fiber foods may help you manage your symptoms. Get help right away if you have severe pain in your abdomen. This information is not intended to replace advice given to you by your health care provider. Make sure you discuss any questions you have with your health care provider. Document Revised: 11/22/2019 Document Reviewed: 11/22/2019 Elsevier Patient Education  2021 Elsevier Inc.  Dr. Pollyann Kennedy, Dr. Gearldine Bienenstock,

## 2023-01-09 ENCOUNTER — Other Ambulatory Visit: Payer: Medicare Other

## 2023-01-09 ENCOUNTER — Other Ambulatory Visit: Payer: Self-pay

## 2023-01-09 ENCOUNTER — Ambulatory Visit (HOSPITAL_COMMUNITY)
Admission: RE | Admit: 2023-01-09 | Discharge: 2023-01-09 | Disposition: A | Discharge status: Home / Self Care | Payer: Medicare Other | Source: Ambulatory Visit | Attending: Physician Assistant | Admitting: Physician Assistant

## 2023-01-09 DIAGNOSIS — R197 Diarrhea, unspecified: Secondary | ICD-10-CM

## 2023-01-09 DIAGNOSIS — R634 Abnormal weight loss: Secondary | ICD-10-CM | POA: Insufficient documentation

## 2023-01-09 DIAGNOSIS — K5731 Diverticulosis of large intestine without perforation or abscess with bleeding: Secondary | ICD-10-CM

## 2023-01-09 DIAGNOSIS — Z86010 Personal history of colonic polyps: Secondary | ICD-10-CM

## 2023-01-09 DIAGNOSIS — R1032 Left lower quadrant pain: Secondary | ICD-10-CM | POA: Diagnosis not present

## 2023-01-09 DIAGNOSIS — K5730 Diverticulosis of large intestine without perforation or abscess without bleeding: Secondary | ICD-10-CM | POA: Diagnosis not present

## 2023-01-09 DIAGNOSIS — K449 Diaphragmatic hernia without obstruction or gangrene: Secondary | ICD-10-CM | POA: Diagnosis not present

## 2023-01-09 MED ORDER — IOHEXOL 9 MG/ML PO SOLN
ORAL | Status: AC
  Filled 2023-01-09: qty 1000

## 2023-01-09 MED ORDER — SODIUM CHLORIDE (PF) 0.9 % IJ SOLN
INTRAMUSCULAR | Status: AC
  Filled 2023-01-09: qty 50

## 2023-01-09 MED ORDER — IOHEXOL 300 MG/ML  SOLN
80.0000 mL | Freq: Once | INTRAMUSCULAR | Status: AC | PRN
  Administered 2023-01-09: 80 mL via INTRAVENOUS

## 2023-01-09 MED ORDER — POTASSIUM CHLORIDE CRYS ER 20 MEQ PO TBCR: 20.0000 meq | EXTENDED_RELEASE_TABLET | Freq: Every day | ORAL | 0 refills | Status: AC

## 2023-01-09 MED ORDER — IOHEXOL 9 MG/ML PO SOLN
1000.0000 mL | ORAL | Status: AC
  Administered 2023-01-09: 1000 mL via ORAL

## 2023-01-10 LAB — CLOSTRIDIUM DIFFICILE TOXIN B, QUALITATIVE, REAL-TIME PCR: Toxigenic C. Difficile by PCR: NOT DETECTED

## 2023-01-17 LAB — PANCREATIC ELASTASE, FECAL: Pancreatic Elastase-1, Stool: 156 mcg/g — ABNORMAL LOW

## 2023-01-20 ENCOUNTER — Other Ambulatory Visit: Payer: Self-pay

## 2023-01-20 LAB — FECAL FAT, QUALITATIVE
Fat Qual Neutral, Stl: NORMAL
Fat Qual Total, Stl: NORMAL

## 2023-01-20 MED ORDER — PANCRELIPASE (LIP-PROT-AMYL) 36000-114000 UNITS PO CPEP: ORAL_CAPSULE | ORAL | 11 refills | Status: DC

## 2023-01-22 DIAGNOSIS — E559 Vitamin D deficiency, unspecified: Secondary | ICD-10-CM | POA: Diagnosis not present

## 2023-01-22 DIAGNOSIS — Z9181 History of falling: Secondary | ICD-10-CM | POA: Diagnosis not present

## 2023-01-22 DIAGNOSIS — I10 Essential (primary) hypertension: Secondary | ICD-10-CM | POA: Diagnosis not present

## 2023-01-22 DIAGNOSIS — Z0000 Encounter for general adult medical examination without abnormal findings: Secondary | ICD-10-CM | POA: Diagnosis not present

## 2023-01-22 DIAGNOSIS — F17200 Nicotine dependence, unspecified, uncomplicated: Secondary | ICD-10-CM | POA: Diagnosis not present

## 2023-01-22 DIAGNOSIS — M810 Age-related osteoporosis without current pathological fracture: Secondary | ICD-10-CM | POA: Diagnosis not present

## 2023-01-22 DIAGNOSIS — K8689 Other specified diseases of pancreas: Secondary | ICD-10-CM | POA: Diagnosis not present

## 2023-01-22 DIAGNOSIS — I700 Atherosclerosis of aorta: Secondary | ICD-10-CM | POA: Diagnosis not present

## 2023-01-22 DIAGNOSIS — J449 Chronic obstructive pulmonary disease, unspecified: Secondary | ICD-10-CM | POA: Diagnosis not present

## 2023-01-22 DIAGNOSIS — E785 Hyperlipidemia, unspecified: Secondary | ICD-10-CM | POA: Diagnosis not present

## 2023-01-23 ENCOUNTER — Telehealth: Payer: Self-pay | Admitting: Physician Assistant

## 2023-01-23 NOTE — Telephone Encounter (Signed)
Abigail Aguirre, Patient says Creon is too expensive, I see she was offered samples from [place] but didn't want them. What else do you recommend she take?   Thanks

## 2023-01-23 NOTE — Telephone Encounter (Signed)
Inbound call from patient stating Creon is expensive for her to pay every 2 weeks. Requesting a call back to see what options she has. Please advise, thank you.

## 2023-01-24 NOTE — Telephone Encounter (Signed)
Spoke with patient and she is willing to try AbbVie PAP. She wants me to mail her the application to her home. Again I offered her samples of Creon. She said she may call next week for samples but she has enough for 1 week

## 2023-01-27 ENCOUNTER — Other Ambulatory Visit: Payer: Self-pay | Admitting: Family Medicine

## 2023-01-27 ENCOUNTER — Telehealth: Payer: Self-pay

## 2023-01-27 DIAGNOSIS — N644 Mastodynia: Secondary | ICD-10-CM

## 2023-01-27 NOTE — Telephone Encounter (Signed)
Called patient to let her know of her labs needed, patient expressed concern over creon being too expensive, sent out a creon assistance sheet and notified patient. Patient verbalized understanding

## 2023-01-29 ENCOUNTER — Other Ambulatory Visit (INDEPENDENT_AMBULATORY_CARE_PROVIDER_SITE_OTHER): Payer: Medicare Other

## 2023-01-29 DIAGNOSIS — R911 Solitary pulmonary nodule: Secondary | ICD-10-CM | POA: Diagnosis not present

## 2023-01-29 DIAGNOSIS — I10 Essential (primary) hypertension: Secondary | ICD-10-CM | POA: Diagnosis not present

## 2023-01-29 DIAGNOSIS — L409 Psoriasis, unspecified: Secondary | ICD-10-CM | POA: Diagnosis not present

## 2023-01-29 DIAGNOSIS — E876 Hypokalemia: Secondary | ICD-10-CM | POA: Diagnosis not present

## 2023-01-29 DIAGNOSIS — E785 Hyperlipidemia, unspecified: Secondary | ICD-10-CM | POA: Diagnosis not present

## 2023-01-29 DIAGNOSIS — Z0000 Encounter for general adult medical examination without abnormal findings: Secondary | ICD-10-CM | POA: Diagnosis not present

## 2023-01-29 LAB — CBC WITH DIFFERENTIAL/PLATELET
Basophils Absolute: 0 10*3/uL (ref 0.0–0.1)
Basophils Relative: 0.4 % (ref 0.0–3.0)
Eosinophils Absolute: 0.4 10*3/uL (ref 0.0–0.7)
Eosinophils Relative: 4.2 % (ref 0.0–5.0)
HCT: 44.5 % (ref 36.0–46.0)
Hemoglobin: 14.5 g/dL (ref 12.0–15.0)
Lymphocytes Relative: 32.7 % (ref 12.0–46.0)
Lymphs Abs: 3.2 10*3/uL (ref 0.7–4.0)
MCHC: 32.6 g/dL (ref 30.0–36.0)
MCV: 95 fl (ref 78.0–100.0)
Monocytes Absolute: 0.5 10*3/uL (ref 0.1–1.0)
Monocytes Relative: 5.6 % (ref 3.0–12.0)
Neutro Abs: 5.5 10*3/uL (ref 1.4–7.7)
Neutrophils Relative %: 57.1 % (ref 43.0–77.0)
Platelets: 185 10*3/uL (ref 150.0–400.0)
RBC: 4.68 Mil/uL (ref 3.87–5.11)
RDW: 14.1 % (ref 11.5–15.5)
WBC: 9.6 10*3/uL (ref 4.0–10.5)

## 2023-01-29 LAB — COMPREHENSIVE METABOLIC PANEL
ALT: 11 U/L (ref 0–35)
AST: 16 U/L (ref 0–37)
Albumin: 4.2 g/dL (ref 3.5–5.2)
Alkaline Phosphatase: 71 U/L (ref 39–117)
BUN: 10 mg/dL (ref 6–23)
CO2: 31 mEq/L (ref 19–32)
Calcium: 10 mg/dL (ref 8.4–10.5)
Chloride: 99 mEq/L (ref 96–112)
Creatinine, Ser: 0.81 mg/dL (ref 0.40–1.20)
GFR: 69.5 mL/min (ref >60.00)
Glucose, Bld: 147 mg/dL — ABNORMAL HIGH (ref 70–99)
Potassium: 3.4 mEq/L — ABNORMAL LOW (ref 3.5–5.1)
Sodium: 137 mEq/L (ref 135–145)
Total Bilirubin: 0.5 mg/dL (ref 0.2–1.2)
Total Protein: 6.9 g/dL (ref 6.0–8.3)

## 2023-01-29 LAB — MAGNESIUM: Magnesium: 1.9 mg/dL (ref 1.5–2.5)

## 2023-01-31 ENCOUNTER — Other Ambulatory Visit: Payer: Self-pay | Admitting: Internal Medicine

## 2023-01-31 DIAGNOSIS — R911 Solitary pulmonary nodule: Secondary | ICD-10-CM

## 2023-02-03 NOTE — Telephone Encounter (Signed)
Printed application for the patient and mailed today, I highlighted the areas she needs to fill out

## 2023-02-04 ENCOUNTER — Ambulatory Visit
Admission: RE | Admit: 2023-02-04 | Discharge: 2023-02-04 | Disposition: A | Discharge status: Home / Self Care | Payer: Medicare Other | Source: Ambulatory Visit | Attending: Family Medicine | Admitting: Family Medicine

## 2023-02-04 ENCOUNTER — Ambulatory Visit: Payer: Medicare Other

## 2023-02-04 DIAGNOSIS — N644 Mastodynia: Secondary | ICD-10-CM

## 2023-02-26 ENCOUNTER — Telehealth: Payer: Self-pay | Admitting: Physician Assistant

## 2023-02-26 NOTE — Telephone Encounter (Signed)
Inbound call from patient stating she filled out Abbvie assistance sheet that was mailed to her towards the begging of July. States she forwarded it back to [place] to send over to [place] for Creon medication. States Denyse Amass has not received anything just yet. Requesting a call back to discuss further. Please advise, thank you.

## 2023-02-28 NOTE — Telephone Encounter (Signed)
Patient called requesting a call back to discuss Creon paperwork. Please advise.

## 2023-03-11 DIAGNOSIS — H938X3 Other specified disorders of ear, bilateral: Secondary | ICD-10-CM | POA: Diagnosis not present

## 2023-03-12 ENCOUNTER — Telehealth: Payer: Self-pay | Admitting: Physician Assistant

## 2023-03-12 NOTE — Telephone Encounter (Signed)
Inbound call from patient requesting to speak with Abigail Aguirre regarding medications that were trying to go through [employer]. Please advise, thank you.

## 2023-03-12 NOTE — Progress Notes (Unsigned)
03/13/2023 Abigail Aguirre [id number] [birth year]  Referring provider: Charlane Ferretti, DO Primary GI doctor: Dr. Lavon Paganini  ASSESSMENT AND PLAN:   77 year old female with history of COPD, continues to smoke, GERD with hiatal hernia, psoriasis on Rinvoq, presented with weight loss, diarrhea and GERD with dysphagia Found to have EPI CT abdomen pelvis recently with normal pancreas Responding well to Creon but having difficult time with payment, will try to look into this. Continue Creon, instructed taking with food  GERD with dysphagia, smoking history Barium swallow 2021 showed moderate esophageal dysmotility, hiatal hernia, GERD, no masses or strictures Advised patient stop NSAIDs, quit smoking Instructed patient to try to take the Prilosec 40 mg every single day for at least a month to see if this is more of an LPR Barium swallow ordered but was not done, will get this done this time. Has follow-up with the ear nose and throat for laryngealoscopy Early satiety, given gastroparesis diet, if everything's unremarkable can consider GES  Personal history of adenomatous colon polyps Last colonoscopy 2018 with Dr. Myrtie Cruise 1 adenomatous polyp no recall secondary to age No rectal bleeding, not interested in colonoscopy at this time   Patient Care Team: Charlane Ferretti, DO as PCP - General (Internal Medicine) Jake Bathe, MD as PCP - Cardiology (Cardiology)  HISTORY OF PRESENT ILLNESS: 77 y.o. female with a past medical history of anxiety, depression, hypertension, hyperlipidemia, COPD with emphysema, GERD with hiatal hernia, personal history of tubular adenomatous polyps, psoriasis and others listed below presents for evaluation of diarrhea weight loss.   10/08/2022 labs in [place] showed normal electrolytes, BUN 15, creatinine 0.75, alk phos 77, AST 13, ALT 8, albumin 4.3, total bilirubin 0.5, TSH 1.2, WBC 9.4, Hgb 14.4, MCV 94.2, platelets 212 Patient is on Rinvoq for  psoriasis  09/02/2016 CT AB and pelvis diverticulitis sigmoid colon 09/20/2016 colonoscopy Dr. Myrtie Cruise [facility] 1 tubular adenoma and 1 hyperplastic polyp was removed, recommended recall colonoscopy in 5 years March 2023 10/24/2016 EGD LA grade B reflux esophagitis, small hiatal hernia and gastritis.  02/28/2020 office visit with Dr. Lavon Paganini as a new patient for evaluation of dysphagia, referred to pelvic floor physical therapy 03/10/2020 barium swallow showed small to moderate sliding-type hiatal hernia, nonspecific esophageal motility disorder and reflux, no stricture or mass.  MBS normal with normal oropharyngeal function no aspiration.  01/08/2023 OV with myself  CT abdomen pelvis with contrast 6/13 showed sigmoid diverticulosis, stable moderate HH, new 9 mm right lower lobe pulmonary nodule recall 3 to 6 months CBC showed thrombocytopenia at 134, no leukocytosis, potassium 3.1, AST elevated 59, ALT 44, negative sed rate CRP, normal thyroid.   Patient had low pancreatic elastase at 178 and was given Creon. Potassium was rechecked at 3.4 from 3.1. Will recheck today.   The creon has helped, she states her diarrhea has improved significantly.  She is taking 2 with a meal, not always with snacks to try to stretch the medication. She has less AB discomfort, cramping.  Weight is up 3 lbs from last visit.  She states she is having a hard time getting approval from creon.  Her appetite has improved, eating 2 meals a day.  She is on the omeprazole as needed still, still having some intermittent dysphagia, throat clearing, hoarseness, mucus. Can have choking with it too.  Has been following with Dr. Aleene Davidson.  Wt Readings from Last 3 Encounters:  03/13/23 118 lb 6 oz (53.7 kg)  01/08/23 115 lb 4  oz (52.3 kg)  03/13/20 131 lb (59.4 kg)   She denies blood thinner use.  She reports NSAID use, she is on aleve PRN, can be twice a week.  She denies ETOH use.   She reports tobacco use.  She  denies drug use.    She  reports that she has been smoking cigarettes. She has a 40 pack-year smoking history. She has never used smokeless tobacco. She reports that she does not drink alcohol and does not use drugs.  RELEVANT LABS AND IMAGING: CBC    Component Value Date/Time   WBC 9.6 01/29/2023 1142   RBC 4.68 01/29/2023 1142   HGB 14.5 01/29/2023 1142   HGB 14.0 07/03/2021 1434   HCT 44.5 01/29/2023 1142   HCT 41.5 07/03/2021 1434   PLT 185.0 01/29/2023 1142   PLT 202 07/03/2021 1434   MCV 95.0 01/29/2023 1142   MCV 92 07/03/2021 1434   MCH 31.2 07/03/2021 1434   MCH 31.7 08/10/2016 1831   MCHC 32.6 01/29/2023 1142   RDW 14.1 01/29/2023 1142   RDW 12.5 07/03/2021 1434   LYMPHSABS 3.2 01/29/2023 1142   LYMPHSABS 3.3 (H) 07/03/2021 1434   MONOABS 0.5 01/29/2023 1142   EOSABS 0.4 01/29/2023 1142   EOSABS 0.6 (H) 07/03/2021 1434   BASOSABS 0.0 01/29/2023 1142   BASOSABS 0.1 07/03/2021 1434   Recent Labs    01/08/23 1236 01/29/23 1142  HGB 14.6 14.5    CMP     Component Value Date/Time   NA 137 01/29/2023 1142   NA 147 (H) 07/03/2021 1434   K 3.4 (L) 01/29/2023 1142   CL 99 01/29/2023 1142   CO2 31 01/29/2023 1142   GLUCOSE 147 (H) 01/29/2023 1142   BUN 10 01/29/2023 1142   BUN 9 07/03/2021 1434   CREATININE 0.81 01/29/2023 1142   CALCIUM 10.0 01/29/2023 1142   PROT 6.9 01/29/2023 1142   PROT 6.8 07/03/2021 1434   ALBUMIN 4.2 01/29/2023 1142   ALBUMIN 4.1 07/03/2021 1434   AST 16 01/29/2023 1142   ALT 11 01/29/2023 1142   ALKPHOS 71 01/29/2023 1142   BILITOT 0.5 01/29/2023 1142   BILITOT <0.2 07/03/2021 1434   GFRNONAA >60 08/10/2016 1830   GFRAA >60 08/10/2016 1830      Latest Ref Rng & Units 01/29/2023   11:42 AM 01/08/2023   12:36 PM 07/03/2021    2:34 PM  Hepatic Function  Total Protein 6.0 - 8.3 g/dL 6.9  7.2  6.8   Albumin 3.5 - 5.2 g/dL 4.2  4.1  4.1   AST 0 - 37 U/L 16  59  13   ALT 0 - 35 U/L 11  44  11   Alk Phosphatase 39 - 117 U/L 71  99   83   Total Bilirubin 0.2 - 1.2 mg/dL 0.5  0.5  <4.0       Current Medications:    Current Outpatient Medications (Cardiovascular):    lisinopril (PRINIVIL,ZESTRIL) 20 MG tablet, Take 20 mg by mouth every morning.  Current Outpatient Medications (Respiratory):    cetirizine (ZYRTEC) 10 MG tablet, Take 10 mg by mouth daily.    Current Outpatient Medications (Other):    AMBULATORY NON FORMULARY MEDICATION, Methylation Complete 1 daily B12, Folate, B6, P5P   clobetasol ointment (TEMOVATE) 0.05 %, Apply 1 Application topically.   lipase/protease/amylase (CREON) 36000 UNITS CPEP capsule, Take 2 capsules (72,000 Units total) by mouth 3 (three) times daily with meals. May also take 1 capsule (  36,000 Units total) as needed (with snacks).   MAGNESIUM GLYCINATE PO, Take 1 capsule by mouth daily.   Multiple Minerals-Vitamins (NUTRA-SUPPORT BONE PO), Take 1 capsule by mouth daily.   Multiple Vitamin (MULTIVITAMIN) capsule, Take 1 capsule by mouth daily.   omeprazole (PRILOSEC) 40 MG capsule, Take 1 capsule (40 mg total) by mouth daily before breakfast.   potassium chloride (KLOR-CON M) 20 MEQ tablet, Take 1 tablet (20 mEq total) by mouth daily.   Probiotic Product (PROBIOTIC DAILY PO), Take 1 capsule by mouth daily.  Medical History:  Past Medical History:  Diagnosis Date   Anxiety    Atrial fibrillation (HCC)    Depression    Diverticulosis    Emphysema lung (HCC)    mild   GERD (gastroesophageal reflux disease)    Hiatal hernia    HLD (hyperlipidemia)    Hypertension    IBS (irritable bowel syndrome)    Tubular adenoma of colon 10/24/2016   Allergies:  Allergies  Allergen Reactions   Desvenlafaxine     Other Reaction(s): dizzy   Duloxetine     Other Reaction(s): dizzy   Fluoxetine     Other Reaction(s): numb, Unknown   Penicillin G     Other Reaction(s): abdominal pain   Quetiapine     Other Reaction(s): dizzy, Unknown   Venlafaxine     Other Reaction(s): shaky, knot in  stomach   Mercaptobenzothiazole Rash   Wellbutrin [Bupropion Hcl] Rash     Surgical History:  She  has a past surgical history that includes Tonsillectomy; Partial hysterectomy; and Tubal ligation. Family History:  Her family history includes Dementia in her mother; Hashimoto's thyroiditis in her son; Hearing loss in her sister; Heart attack in her father; Heart disease in her paternal grandfather; Hypertension in her mother and son; Lung cancer in her paternal grandfather; Other in her father; Stroke in her father.  REVIEW OF SYSTEMS  : All other systems reviewed and negative except where noted in the History of Present Illness.  PHYSICAL EXAM: BP (!) 150/84   Pulse (!) 53   Ht 4\' 9"  (1.448 m)   Wt 118 lb 6 oz (53.7 kg)   SpO2 97%   BMI 25.62 kg/m  General Appearance: Well nourished, in no apparent distress. Head:   Normocephalic and atraumatic. Eyes:  sclerae anicteric,conjunctive pink  Respiratory: Respiratory effort normal, BS equal bilaterally without rales, rhonchi, wheezing. Cardio: Irregular irregular or regular pulse, possible trigeminy, with no MRGs. Peripheral pulses intact.  Abdomen: Soft,  Non-distended ,active bowel sounds. mild tenderness in the LLQ. With guarding and With rebound. No masses. Rectal: Not evaluated Musculoskeletal: Full ROM, Normal gait. Without edema. Skin:  Dry and intact without significant lesions or rashes Neuro: Alert and  oriented x4;  No focal deficits. Psych:  Cooperative. Normal mood and affect.    Doree Albee, PA-C 2:29 PM

## 2023-03-13 ENCOUNTER — Encounter: Payer: Self-pay | Admitting: Physician Assistant

## 2023-03-13 ENCOUNTER — Ambulatory Visit: Payer: Medicare Other | Admitting: Physician Assistant

## 2023-03-13 VITALS — BP 140/72 | HR 53 | Ht <= 58 in | Wt 118.4 lb

## 2023-03-13 DIAGNOSIS — K8681 Exocrine pancreatic insufficiency: Secondary | ICD-10-CM | POA: Diagnosis not present

## 2023-03-13 DIAGNOSIS — R634 Abnormal weight loss: Secondary | ICD-10-CM | POA: Diagnosis not present

## 2023-03-13 DIAGNOSIS — R1310 Dysphagia, unspecified: Secondary | ICD-10-CM | POA: Diagnosis not present

## 2023-03-13 DIAGNOSIS — Z86010 Personal history of colonic polyps: Secondary | ICD-10-CM

## 2023-03-13 DIAGNOSIS — K5731 Diverticulosis of large intestine without perforation or abscess with bleeding: Secondary | ICD-10-CM

## 2023-03-13 DIAGNOSIS — B372 Candidiasis of skin and nail: Secondary | ICD-10-CM | POA: Diagnosis not present

## 2023-03-13 MED ORDER — KETOCONAZOLE 2 % EX CREA
1.0000 | TOPICAL_CREAM | Freq: Every day | CUTANEOUS | 1 refills | Status: AC
Start: 2023-03-13 — End: ?

## 2023-03-13 MED ORDER — NYSTATIN 100000 UNIT/GM EX POWD
Freq: Two times a day (BID) | CUTANEOUS | 2 refills | Status: AC | PRN
Start: 2023-03-13 — End: ?

## 2023-03-13 NOTE — Patient Instructions (Addendum)
You have been scheduled for a Barium Esophogram at [facility] (1st floor of the hospital) on 04/03/23 at 10:00 am . Please arrive 30 minutes prior to your appointment for registration. Make certain not to have anything to eat or drink 3 hours prior to your test. If you need to reschedule for any reason, please contact radiology at [phone] to do so. __________________________________________________________________ A barium swallow is an examination that concentrates on views of the esophagus. This tends to be a double contrast exam (barium and two liquids which, when combined, create a gas to distend the wall of the oesophagus) or single contrast (non-ionic iodine based). The study is usually tailored to your symptoms so a good history is essential. Attention is paid during the study to the form, structure and configuration of the esophagus, looking for functional disorders (such as aspiration, dysphagia, achalasia, motility and reflux) EXAMINATION You may be asked to change into a gown, depending on the type of swallow being performed. A radiologist and radiographer will perform the procedure. The radiologist will advise you of the type of contrast selected for your procedure and direct you during the exam. You will be asked to stand, sit or lie in several different positions and to hold a small amount of fluid in your mouth before being asked to swallow while the imaging is performed .In some instances you may be asked to swallow barium coated marshmallows to assess the motility of a solid food bolus. The exam can be recorded as a digital or video fluoroscopy procedure. POST PROCEDURE It will take 1-2 days for the barium to pass through your system. To facilitate this, it is important, unless otherwise directed, to increase your fluids for the next 24-48hrs and to resume your normal diet.  This test typically takes about 30 minutes to  perform. ______________________________________________________________________________   GET ON THE OMEPRAZOLE DAILY FOR AT LEAST 1 MONTH- READ BELOW  WE WILL WORK ON THE CREON AND TRY TO GET THAT FOR YOU. (Samples given today.)  FOLLOW UP WITH YOUR EAR NOSE AND THROAT DOCTOR  Silent reflux: Not all heartburn burns...Marland KitchenMarland KitchenMarland Kitchen  What is LPR? Laryngopharyngeal reflux (LPR) or silent reflux is a condition in which acid that is made in the stomach travels up the esophagus (swallowing tube) and gets to the throat. Not everyone with reflux has a lot of heartburn or indigestion. In fact, many people with LPR never have heartburn. This is why LPR is called SILENT REFLUX, and the terms "Silent reflux" and "LPR" are often used interchangeably. Because LPR is silent, it is sometimes difficult to diagnose.  How can you tell if you have LPR?  Chronic hoarseness- Some people have hoarseness that comes and goes throat clearing  Cough It can cause shortness of breath and cause asthma like symptoms. a feeling of a lump in the throat  difficulty swallowing a problem with too much nose and throat drainage.  Some people will feel their esophagus spasm which feels like their heart beating hard and fast, this will usually be after a meal, at rest, or lying down at night.    How do I treat this? Treatment for LPR should be individualized, and your doctor will suggest the best treatment for you. Generally there are several treatments for LPR: changing habits and diet to reduce reflux,  medications to reduce stomach acid, and  surgery to prevent reflux. Most people with LPR need to modify how and when they eat, as well as take some medication, to get  well. Sometimes, nonprescription liquid antacids, such as Maalox, Gelucil and Mylanta are recommended. When used, these antacids should be taken four times each day - one tablespoon one hour after each meal and before bedtime. Dietary and lifestyle changes alone  are not often enough to control LPR - medications that reduce stomach acid are also usually needed. These must be prescribed by our doctor.   TIPS FOR REDUCING REFLUX AND LPR Control your LIFE-STYLE and your DIET! If you use tobacco, QUIT.  Smoking makes you reflux. After every cigarette you have some LPR.  Don't wear clothing that is too tight, especially around the waist (trousers, corsets, belts).  Do not lie down just after eating...in fact, do not eat within three hours of bedtime.  You should be on a low-fat diet.  Limit your intake of red meat.  Limit your intake of butter.  Avoid fried foods.  Avoid chocolate  Avoid cheese.  Avoid eggs. Specifically avoid caffeine (especially coffee and tea), soda pop (especially cola) and mints.  Avoid alcoholic beverages, particularly in the evening.  Exocrine Pancreatic Insufficiency (EPI) EPI is a condition in which your body doesn't provide enough pancreatic enzymes to properly digest your food (which can sometimes lead to some unpleasant digestive symptoms such as bloating, AB pain and loose stools, weight loss).   For many people, EPI is also a chronic lifelong condition.   That's why its so important to know what to expect with your EPI treatment, because with the right plan in place, EPI is manageable.  HOW DO I TAKE PANCREATIC ENZYMES?  Pancreatic Enzyme Replacement therapy (Creon) is only available through prescription and cannot be substituted with over the counter alternatives.   Your doctor will personalize your dose based on your weight, diet, and symptoms.  The number of capsules you take per meal will depend on your prescribed dose.  Creon must be taken DURING every meal and snack.   Whether its a full meal or a snack, take Creon every time you eat. Remember to follow your treatment plan closely and take Creon exactly as prescribed - consistency is key!!  Dose adjustments are normal with Creon.  Your doctor will start your  on the dose they deem appropriate for you.   Remember to follow up with your doctor after the first two weeks to ensure your therapy is effective and you're managing your treatment appropriately.  Diverticulosis Diverticulosis is a condition that develops when small pouches (diverticula) form in the wall of the large intestine (colon). The colon is where water is absorbed and stool (feces) is formed. The pouches form when the inside layer of the colon pushes through weak spots in the outer layers of the colon. You may have a few pouches or many of them. The pouches usually do not cause problems unless they become inflamed or infected. When this happens, the condition is called diverticulitis- this is left lower quadrant pain, diarrhea, fever, chills, nausea or vomiting.  If this occurs please call the office or go to the hospital. Sometimes these patches without inflammation can also have painless bleeding associated with them, if this happens please call the office or go to the hospital. Preventing constipation and increasing fiber can help reduce diverticula and prevent complications. Even if you feel you have a high-fiber diet, suggest getting on Benefiber or Cirtracel 2 times daily.  Thank you for choosing me and [facility].  Quentin Mulling, PA-C

## 2023-04-03 ENCOUNTER — Telehealth: Payer: Self-pay | Admitting: Physician Assistant

## 2023-04-03 ENCOUNTER — Ambulatory Visit (HOSPITAL_COMMUNITY)
Admission: RE | Admit: 2023-04-03 | Discharge: 2023-04-03 | Disposition: A | Discharge status: Home / Self Care | Payer: Medicare Other | Source: Ambulatory Visit | Attending: Physician Assistant | Admitting: Physician Assistant

## 2023-04-03 DIAGNOSIS — R1310 Dysphagia, unspecified: Secondary | ICD-10-CM | POA: Diagnosis not present

## 2023-04-03 DIAGNOSIS — K219 Gastro-esophageal reflux disease without esophagitis: Secondary | ICD-10-CM | POA: Diagnosis not present

## 2023-04-03 NOTE — Telephone Encounter (Signed)
Left voicemail for patient to call back. 

## 2023-04-03 NOTE — Telephone Encounter (Signed)
Patient returned call, please advise.  

## 2023-04-03 NOTE — Telephone Encounter (Signed)
Left another message for patient to call back 

## 2023-04-03 NOTE — Telephone Encounter (Signed)
Patient states that she has had a rash for the past several days. She isn't sure if it's a reaction to the creon or not. She did says that she isn't taking any of her other medications.

## 2023-04-04 ENCOUNTER — Other Ambulatory Visit: Payer: Self-pay | Admitting: Physician Assistant

## 2023-04-04 NOTE — Telephone Encounter (Signed)
Patient states that she has had several days of itching diffusely on the body and has a rash that is localized to her right back. She states that up until the last couple days, she has only been taking creon so she wonders if she is having a reaction to the creon. Patient also describes that she has been having itching behind her ears and at her naval and had recently requested nystatin at her office visit with Quentin Mulling, PA-C. This was given, but she has not yet picked up the medication.  Patient states she stopped creon for a couple days, but then started back after speaking with creon patient advocate who told her that rash/itching is not a side effect that is typically reported for the medication. She has also since started back on her other regular medications. She has been taking zyrtec as well and feels this has been helpful. No SOB, throat swelling etc noted.  Patient does have a history of chronic plaque psoriasis and eczema for whom she sees dermatology.  Discussed with the patient that her symptoms seem more likely related to the itching she was already experiencing and if it were a drug reaction would more likely cause diffuse rash, not localized to only one area. She is asked to stop the medication though should she develop any worsening symptoms or continued rash and to let us know.  In addition, patient requested information about her barium swallow. I did explain that Quentin Mulling, PA-C recommendations/interpretation and that we would await Dr Lavon Paganini to give the final ok to schedule endoscopy. Patient verbalizes understanding and denies any additional questions or concerns.

## 2023-04-10 DIAGNOSIS — H906 Mixed conductive and sensorineural hearing loss, bilateral: Secondary | ICD-10-CM | POA: Diagnosis not present

## 2023-04-16 DIAGNOSIS — R1319 Other dysphagia: Secondary | ICD-10-CM | POA: Diagnosis not present

## 2023-04-16 DIAGNOSIS — H906 Mixed conductive and sensorineural hearing loss, bilateral: Secondary | ICD-10-CM | POA: Diagnosis not present

## 2023-04-16 DIAGNOSIS — J309 Allergic rhinitis, unspecified: Secondary | ICD-10-CM | POA: Diagnosis not present

## 2023-04-29 NOTE — Telephone Encounter (Signed)
Patient calling back in regards to previous note. Please advise.

## 2023-04-29 NOTE — Telephone Encounter (Signed)
Patient says that she was returning a call to our office. States that she wasn't sure what the message was, but thought the can was from today, the person was "Karen Kitchens" and it was "something about money or something." I advised that I have not reached out to her, and certainly not about any financial concerns. I cannot see where our office has attempted to contact her. I did tell her that should she be able to decipher the message a bit more and get a name, I may be able to direct her to whomever called. Apologized that I could not be of help at this time. Patient thanked me for the call.

## 2023-05-22 ENCOUNTER — Other Ambulatory Visit: Payer: Self-pay | Admitting: Family Medicine

## 2023-05-22 DIAGNOSIS — R911 Solitary pulmonary nodule: Secondary | ICD-10-CM

## 2023-05-29 ENCOUNTER — Ambulatory Visit
Admission: RE | Admit: 2023-05-29 | Discharge: 2023-05-29 | Disposition: A | Discharge status: Home / Self Care | Payer: Medicare Other | Source: Ambulatory Visit | Attending: Family Medicine | Admitting: Family Medicine

## 2023-05-29 DIAGNOSIS — R918 Other nonspecific abnormal finding of lung field: Secondary | ICD-10-CM | POA: Diagnosis not present

## 2023-05-29 DIAGNOSIS — K449 Diaphragmatic hernia without obstruction or gangrene: Secondary | ICD-10-CM | POA: Diagnosis not present

## 2023-05-29 DIAGNOSIS — I700 Atherosclerosis of aorta: Secondary | ICD-10-CM | POA: Diagnosis not present

## 2023-05-29 DIAGNOSIS — R911 Solitary pulmonary nodule: Secondary | ICD-10-CM

## 2023-06-11 ENCOUNTER — Other Ambulatory Visit: Payer: Medicare Other

## 2023-08-20 DIAGNOSIS — H2513 Age-related nuclear cataract, bilateral: Secondary | ICD-10-CM | POA: Diagnosis not present

## 2023-09-09 DIAGNOSIS — H25043 Posterior subcapsular polar age-related cataract, bilateral: Secondary | ICD-10-CM | POA: Diagnosis not present

## 2023-09-09 DIAGNOSIS — H2513 Age-related nuclear cataract, bilateral: Secondary | ICD-10-CM | POA: Diagnosis not present

## 2023-09-09 DIAGNOSIS — H18413 Arcus senilis, bilateral: Secondary | ICD-10-CM | POA: Diagnosis not present

## 2023-09-09 DIAGNOSIS — H2511 Age-related nuclear cataract, right eye: Secondary | ICD-10-CM | POA: Diagnosis not present

## 2023-09-09 DIAGNOSIS — H25013 Cortical age-related cataract, bilateral: Secondary | ICD-10-CM | POA: Diagnosis not present

## 2023-10-04 ENCOUNTER — Emergency Department (HOSPITAL_COMMUNITY)
Admission: EM | Admit: 2023-10-04 | Discharge: 2023-10-04 | Disposition: A | Discharge status: Home / Self Care | Attending: Emergency Medicine | Admitting: Emergency Medicine

## 2023-10-04 ENCOUNTER — Encounter (HOSPITAL_COMMUNITY): Payer: Self-pay

## 2023-10-04 ENCOUNTER — Ambulatory Visit (HOSPITAL_COMMUNITY): Admission: EM | Admit: 2023-10-04 | Discharge: 2023-10-04 | Disposition: A | Discharge status: Home / Self Care

## 2023-10-04 ENCOUNTER — Other Ambulatory Visit: Payer: Self-pay

## 2023-10-04 ENCOUNTER — Emergency Department (HOSPITAL_COMMUNITY)

## 2023-10-04 DIAGNOSIS — K828 Other specified diseases of gallbladder: Secondary | ICD-10-CM | POA: Diagnosis not present

## 2023-10-04 DIAGNOSIS — Z79899 Other long term (current) drug therapy: Secondary | ICD-10-CM | POA: Diagnosis not present

## 2023-10-04 DIAGNOSIS — I159 Secondary hypertension, unspecified: Secondary | ICD-10-CM | POA: Diagnosis not present

## 2023-10-04 DIAGNOSIS — R918 Other nonspecific abnormal finding of lung field: Secondary | ICD-10-CM | POA: Diagnosis not present

## 2023-10-04 DIAGNOSIS — K5730 Diverticulosis of large intestine without perforation or abscess without bleeding: Secondary | ICD-10-CM | POA: Diagnosis not present

## 2023-10-04 DIAGNOSIS — I10 Essential (primary) hypertension: Secondary | ICD-10-CM | POA: Diagnosis not present

## 2023-10-04 DIAGNOSIS — R1084 Generalized abdominal pain: Secondary | ICD-10-CM | POA: Diagnosis not present

## 2023-10-04 DIAGNOSIS — R1110 Vomiting, unspecified: Secondary | ICD-10-CM | POA: Diagnosis not present

## 2023-10-04 LAB — CBC WITH DIFFERENTIAL/PLATELET
Abs Immature Granulocytes: 0.02 10*3/uL (ref 0.00–0.07)
Basophils Absolute: 0 10*3/uL (ref 0.0–0.1)
Basophils Relative: 0 %
Eosinophils Absolute: 0.3 10*3/uL (ref 0.0–0.5)
Eosinophils Relative: 3 %
HCT: 44 % (ref 36.0–46.0)
Hemoglobin: 14.7 g/dL (ref 12.0–15.0)
Immature Granulocytes: 0 %
Lymphocytes Relative: 22 %
Lymphs Abs: 2 10*3/uL (ref 0.7–4.0)
MCH: 31.5 pg (ref 26.0–34.0)
MCHC: 33.4 g/dL (ref 30.0–36.0)
MCV: 94.2 fL (ref 80.0–100.0)
Monocytes Absolute: 0.7 10*3/uL (ref 0.1–1.0)
Monocytes Relative: 7 %
Neutro Abs: 6.1 10*3/uL (ref 1.7–7.7)
Neutrophils Relative %: 68 %
Platelets: 175 10*3/uL (ref 150–400)
RBC: 4.67 MIL/uL (ref 3.87–5.11)
RDW: 12.6 % (ref 11.5–15.5)
WBC: 9.1 10*3/uL (ref 4.0–10.5)
nRBC: 0 % (ref 0.0–0.2)

## 2023-10-04 LAB — COMPREHENSIVE METABOLIC PANEL
ALT: 31 U/L (ref 0–44)
AST: 73 U/L — ABNORMAL HIGH (ref 15–41)
Albumin: 3.7 g/dL (ref 3.5–5.0)
Alkaline Phosphatase: 81 U/L (ref 38–126)
Anion gap: 9 (ref 5–15)
BUN: 14 mg/dL (ref 8–23)
CO2: 25 mmol/L (ref 22–32)
Calcium: 9.8 mg/dL (ref 8.9–10.3)
Chloride: 105 mmol/L (ref 98–111)
Creatinine, Ser: 0.83 mg/dL (ref 0.44–1.00)
GFR, Estimated: >60 mL/min (ref >60)
Glucose, Bld: 134 mg/dL — ABNORMAL HIGH (ref 70–99)
Potassium: 3.4 mmol/L — ABNORMAL LOW (ref 3.5–5.1)
Sodium: 139 mmol/L (ref 135–145)
Total Bilirubin: 0.8 mg/dL (ref 0.0–1.2)
Total Protein: 6.6 g/dL (ref 6.5–8.1)

## 2023-10-04 LAB — URINALYSIS, ROUTINE W REFLEX MICROSCOPIC
Bacteria, UA: NONE SEEN
Bilirubin Urine: NEGATIVE
Glucose, UA: NEGATIVE mg/dL
Ketones, ur: NEGATIVE mg/dL
Leukocytes,Ua: NEGATIVE
Nitrite: NEGATIVE
Protein, ur: NEGATIVE mg/dL
Specific Gravity, Urine: >1.046 — ABNORMAL HIGH (ref 1.005–1.030)
pH: 6 (ref 5.0–8.0)

## 2023-10-04 LAB — TROPONIN I (HIGH SENSITIVITY)
Troponin I (High Sensitivity): 6 ng/L (ref <18)
Troponin I (High Sensitivity): 6 ng/L (ref <18)

## 2023-10-04 LAB — LIPASE, BLOOD: Lipase: 27 U/L (ref 11–51)

## 2023-10-04 MED ORDER — LABETALOL HCL 5 MG/ML IV SOLN
10.0000 mg | Freq: Once | INTRAVENOUS | Status: AC
  Administered 2023-10-04: 10 mg via INTRAVENOUS
  Filled 2023-10-04: qty 4

## 2023-10-04 MED ORDER — IOHEXOL 350 MG/ML SOLN
75.0000 mL | Freq: Once | INTRAVENOUS | Status: AC | PRN
  Administered 2023-10-04: 75 mL via INTRAVENOUS

## 2023-10-04 NOTE — ED Notes (Signed)
 Patient is being discharged from the Urgent Care and sent to the Emergency Department via POV . Per Nelle Don, patient is in need of higher level of care due to abdominal pain and elevated BP.Marland Kitchen Patient is aware and verbalizes understanding of plan of care.  Vitals:   10/04/23 1651  BP: (!) 220/101  Pulse: 80  Resp: 16  Temp: 97.7 F (36.5 C)  SpO2: 98%

## 2023-10-04 NOTE — Discharge Instructions (Addendum)
 You have been seen and discharged from the emergency department.  You were found to have high blood pressure.  You are given IV medicine for this with resolution.  Your blood work is otherwise normal, CT of the abdomen pelvis does not show any acute findings.  However I suspect that your abdominal pain could be coming from a GI origin.  Follow-up with your GI doctor for repeat evaluation including colonoscopy.   Follow-up with your primary provider for further evaluation and further care. Take home medications as prescribed. If you have any worsening symptoms or further concerns for your health please return to an emergency department for further evaluation.

## 2023-10-04 NOTE — ED Provider Notes (Signed)
 [place] [facility] AT [facility] Provider Note   CSN: [id number] Arrival date & time: 10/04/23  1706     History  Chief Complaint  Patient presents with   Abdominal Pain   Emesis    Abigail Aguirre is a 78 y.o. female.  HPI   78 year old female past medical history of HTN, supposed to be on lisinopril presents the emergency department for evaluation of HTN.  Patient admits that she has not taken her medication in multiple months, because she did not think that she needed it.  She was seen by an outpatient doctor for eye surgery noted to be hypertensive and was sent in for evaluation.  She restarted her lisinopril a couple days ago.  She endorses some generalized symptoms including intermittent chest heaviness, shortness of breath, lower abdominal pain that wraps around to her lower back and upper back.  She has had decreased appetite.  No active chest pain at this time.  Home Medications Prior to Admission medications   Medication Sig Start Date End Date Taking? Authorizing Provider  AMBULATORY NON FORMULARY MEDICATION Methylation Complete 1 daily B12, Folate, B6, P5P    [provider]  cetirizine (ZYRTEC) 10 MG tablet Take 10 mg by mouth daily.    [provider]  clobetasol ointment (TEMOVATE) 0.05 % Apply 1 Application topically. 12/16/22   [provider]  ketoconazole (NIZORAL) 2 % cream Apply 1 Application topically daily. 03/13/23   Doree Albee, PA-C  lipase/protease/amylase (CREON) 36000 UNITS CPEP capsule Take 2 capsules (72,000 Units total) by mouth 3 (three) times daily with meals. May also take 1 capsule (36,000 Units total) as needed (with snacks). 01/20/23   Doree Albee, PA-C  lisinopril (PRINIVIL,ZESTRIL) 20 MG tablet Take 20 mg by mouth every morning.    [provider]  MAGNESIUM GLYCINATE PO Take 1 capsule by mouth daily.    [provider]  Multiple Minerals-Vitamins  (NUTRA-SUPPORT BONE PO) Take 1 capsule by mouth daily.    [provider]  Multiple Vitamin (MULTIVITAMIN) capsule Take 1 capsule by mouth daily.    [provider]  nystatin (MYCOSTATIN/NYSTOP) powder Apply topically 2 (two) times daily as needed. 03/13/23   Doree Albee, PA-C  omeprazole (PRILOSEC) 40 MG capsule TAKE 1 CAPSULE BY MOUTH EVERY DAY BEFORE BREAKFAST 04/04/23   Quentin Mulling R, PA-C  potassium chloride (KLOR-CON M) 20 MEQ tablet Take 1 tablet (20 mEq total) by mouth daily. 01/09/23   Doree Albee, PA-C  Probiotic Product (PROBIOTIC DAILY PO) Take 1 capsule by mouth daily.    [provider]      Allergies    Desvenlafaxine, Duloxetine, Fluoxetine, Penicillin g, Quetiapine, Venlafaxine, Mercaptobenzothiazole, and Wellbutrin [bupropion hcl]    Review of Systems   Review of Systems  Constitutional:  Positive for appetite change. Negative for fever.  Respiratory:  Positive for chest tightness. Negative for shortness of breath.   Cardiovascular:  Negative for chest pain.  Gastrointestinal:  Positive for abdominal pain. Negative for diarrhea and vomiting.  Genitourinary:  Negative for dysuria.  Skin:  Negative for rash.  Neurological:  Negative for headaches.    Physical Exam Updated Vital Signs BP (!) 155/73   Pulse 79   Temp 98.3 F (36.8 C)   Resp 17   Ht 4\' 9"  (1.448 m)   Wt 53.7 kg   SpO2 99%   BMI 25.62 kg/m  Physical Exam Vitals and nursing note reviewed.  Constitutional:  General: She is not in acute distress.    Appearance: Normal appearance.  HENT:     Head: Normocephalic.     Mouth/Throat:     Mouth: Mucous membranes are moist.  Cardiovascular:     Rate and Rhythm: Normal rate.     Comments: Equal palpable radial pulses Pulmonary:     Effort: Pulmonary effort is normal. No respiratory distress.  Abdominal:     General: Bowel sounds are normal. There is no distension.     Palpations: Abdomen is soft.      Tenderness: There is generalized abdominal tenderness. There is no guarding.  Skin:    General: Skin is warm.  Neurological:     Mental Status: She is alert and oriented to person, place, and time. Mental status is at baseline.  Psychiatric:        Mood and Affect: Mood normal.     ED Results / Procedures / Treatments   Labs (all labs ordered are listed, but only abnormal results are displayed) Labs Reviewed  COMPREHENSIVE METABOLIC PANEL - Abnormal; Notable for the following components:      Result Value   Potassium 3.4 (*)    Glucose, Bld 134 (*)    AST 73 (*)    All other components within normal limits  LIPASE, BLOOD  CBC WITH DIFFERENTIAL/PLATELET  URINALYSIS, ROUTINE W REFLEX MICROSCOPIC  TROPONIN I (HIGH SENSITIVITY)    EKG EKG Interpretation Date/Time:  Saturday October 04 2023 17:38:20 EST Ventricular Rate:  83 PR Interval:  167 QRS Duration:  84 QT Interval:  433 QTC Calculation: 509 R Axis:   70  Text Interpretation: Sinus rhythm Atrial premature complex Prolonged QT interval Confirmed by Coralee Pesa [phone]) on 10/04/2023 5:56:05 PM  Radiology No results found.  Procedures Procedures    Medications Ordered in ED Medications  labetalol (NORMODYNE) injection 10 mg (10 mg Intravenous Given 10/04/23 1822)    ED Course/ Medical Decision Making/ A&P                                 Medical Decision Making Amount and/or Complexity of Data Reviewed Labs: ordered. Radiology: ordered.  Risk Prescription drug management.   78 year old female presents emergency department with concern for HTN, has been noncompliant with her lisinopril.  On arrival she was hypertensive with a blood pressure of 234/99.  Endorsing some chest tightness, lower abdominal pain that radiates up her back.  Blood work is reassuring.  Kidney function is normal, troponin is negative.  Urinalysis is unremarkable.  CT angio chest abdomen pelvis shows no dissection or acute  finding.  After dose of IV medicine her blood pressure has trended down nicely.  She feels improved.  I have encouraged her for compliance with lisinopril and otherwise we will plan for outpatient follow-up.  Patient at this time appears safe and stable for discharge and close outpatient follow up. Discharge plan and strict return to ED precautions discussed, patient verbalizes understanding and agreement.        Final Clinical Impression(s) / ED Diagnoses Final diagnoses:  None    Rx / DC Orders ED Discharge Orders     None         Rozelle Logan, DO 10/04/23 2235

## 2023-10-04 NOTE — ED Triage Notes (Signed)
 Pt c/o mid abdominal pain that radiates to left side of abdomen, N/V after eating lunch today

## 2023-10-04 NOTE — ED Triage Notes (Signed)
 Patient c/o abdominal pain after eating lunch she had severe bloating and emesis. Patient states she has something wrong with her pancreas. Patient states she has vomited x 2 today.

## 2023-10-04 NOTE — ED Provider Triage Note (Signed)
 Emergency Medicine Provider Triage Evaluation Note  Abigail Aguirre , a 78 y.o. female  was evaluated in triage.  Pt complains of abdominal pain.  Patient reports upper abdominal pain that began after eating lunch today with associated nausea and vomiting.  Denies changes to bowel habits, dysuria, or fevers.  Review of Systems  Positive: Upper abdominal pain, emesis Negative: CP, SOB  Physical Exam  BP (!) 234/99 (BP Location: Left Arm)   Pulse 84   Temp 98.3 F (36.8 C)   Resp 18   Ht 4\' 9"  (1.448 m)   Wt 53.7 kg   SpO2 95%   BMI 25.62 kg/m  Gen:   Awake, no distress   Resp:  Normal effort  MSK:   Moves extremities without difficulty  Other:    Medical Decision Making  Medically screening exam initiated at 5:21 PM.  Appropriate orders placed.  Glenna Fellows was informed that the remainder of the evaluation will be completed by another provider, this initial triage assessment does not replace that evaluation, and the importance of remaining in the ED until their evaluation is complete.  Blood pressure elevated at 234/99 in triage.  No headaches or chest pain.  Pain is 2/10 on eval.   Maxwell Marion, PA-C 10/04/23 1722

## 2023-10-08 DIAGNOSIS — R82998 Other abnormal findings in urine: Secondary | ICD-10-CM | POA: Diagnosis not present

## 2023-10-08 DIAGNOSIS — R109 Unspecified abdominal pain: Secondary | ICD-10-CM | POA: Diagnosis not present

## 2023-10-08 DIAGNOSIS — M545 Low back pain, unspecified: Secondary | ICD-10-CM | POA: Diagnosis not present

## 2023-10-10 DIAGNOSIS — I10 Essential (primary) hypertension: Secondary | ICD-10-CM | POA: Diagnosis not present

## 2023-10-10 DIAGNOSIS — R1084 Generalized abdominal pain: Secondary | ICD-10-CM | POA: Diagnosis not present

## 2023-10-10 DIAGNOSIS — Z79899 Other long term (current) drug therapy: Secondary | ICD-10-CM | POA: Diagnosis not present

## 2023-10-10 DIAGNOSIS — E785 Hyperlipidemia, unspecified: Secondary | ICD-10-CM | POA: Diagnosis not present

## 2023-10-10 DIAGNOSIS — F17200 Nicotine dependence, unspecified, uncomplicated: Secondary | ICD-10-CM | POA: Diagnosis not present

## 2023-10-10 DIAGNOSIS — Z860101 Personal history of adenomatous and serrated colon polyps: Secondary | ICD-10-CM | POA: Diagnosis not present

## 2023-10-13 DIAGNOSIS — R7401 Elevation of levels of liver transaminase levels: Secondary | ICD-10-CM | POA: Diagnosis not present

## 2023-10-16 DIAGNOSIS — R7401 Elevation of levels of liver transaminase levels: Secondary | ICD-10-CM | POA: Diagnosis not present

## 2023-10-20 DIAGNOSIS — Z860101 Personal history of adenomatous and serrated colon polyps: Secondary | ICD-10-CM | POA: Diagnosis not present

## 2023-10-20 DIAGNOSIS — R1310 Dysphagia, unspecified: Secondary | ICD-10-CM | POA: Diagnosis not present

## 2023-10-21 DIAGNOSIS — R7401 Elevation of levels of liver transaminase levels: Secondary | ICD-10-CM | POA: Diagnosis not present

## 2023-10-22 DIAGNOSIS — H2513 Age-related nuclear cataract, bilateral: Secondary | ICD-10-CM | POA: Diagnosis not present

## 2023-10-22 DIAGNOSIS — H2511 Age-related nuclear cataract, right eye: Secondary | ICD-10-CM | POA: Diagnosis not present

## 2023-10-23 DIAGNOSIS — H2512 Age-related nuclear cataract, left eye: Secondary | ICD-10-CM | POA: Diagnosis not present

## 2023-11-19 DIAGNOSIS — H2513 Age-related nuclear cataract, bilateral: Secondary | ICD-10-CM | POA: Diagnosis not present

## 2023-11-19 DIAGNOSIS — H2512 Age-related nuclear cataract, left eye: Secondary | ICD-10-CM | POA: Diagnosis not present

## 2023-11-21 DIAGNOSIS — E785 Hyperlipidemia, unspecified: Secondary | ICD-10-CM | POA: Diagnosis not present

## 2023-11-21 DIAGNOSIS — R7401 Elevation of levels of liver transaminase levels: Secondary | ICD-10-CM | POA: Diagnosis not present

## 2023-12-11 DIAGNOSIS — J441 Chronic obstructive pulmonary disease with (acute) exacerbation: Secondary | ICD-10-CM | POA: Diagnosis not present

## 2023-12-26 DIAGNOSIS — D122 Benign neoplasm of ascending colon: Secondary | ICD-10-CM | POA: Diagnosis not present

## 2023-12-26 DIAGNOSIS — K222 Esophageal obstruction: Secondary | ICD-10-CM | POA: Diagnosis not present

## 2023-12-26 DIAGNOSIS — K5730 Diverticulosis of large intestine without perforation or abscess without bleeding: Secondary | ICD-10-CM | POA: Diagnosis not present

## 2023-12-26 DIAGNOSIS — K449 Diaphragmatic hernia without obstruction or gangrene: Secondary | ICD-10-CM | POA: Diagnosis not present

## 2023-12-26 DIAGNOSIS — K319 Disease of stomach and duodenum, unspecified: Secondary | ICD-10-CM | POA: Diagnosis not present

## 2023-12-26 DIAGNOSIS — K2970 Gastritis, unspecified, without bleeding: Secondary | ICD-10-CM | POA: Diagnosis not present

## 2023-12-26 DIAGNOSIS — Z860100 Personal history of colon polyps, unspecified: Secondary | ICD-10-CM | POA: Diagnosis not present

## 2023-12-26 DIAGNOSIS — Z09 Encounter for follow-up examination after completed treatment for conditions other than malignant neoplasm: Secondary | ICD-10-CM | POA: Diagnosis not present

## 2023-12-26 DIAGNOSIS — R1310 Dysphagia, unspecified: Secondary | ICD-10-CM | POA: Diagnosis not present

## 2023-12-26 DIAGNOSIS — K2100 Gastro-esophageal reflux disease with esophagitis, without bleeding: Secondary | ICD-10-CM | POA: Diagnosis not present

## 2023-12-26 LAB — HM COLONOSCOPY

## 2023-12-30 DIAGNOSIS — K319 Disease of stomach and duodenum, unspecified: Secondary | ICD-10-CM | POA: Diagnosis not present

## 2023-12-30 DIAGNOSIS — K2100 Gastro-esophageal reflux disease with esophagitis, without bleeding: Secondary | ICD-10-CM | POA: Diagnosis not present

## 2023-12-30 DIAGNOSIS — D122 Benign neoplasm of ascending colon: Secondary | ICD-10-CM | POA: Diagnosis not present

## 2024-02-23 DIAGNOSIS — N3001 Acute cystitis with hematuria: Secondary | ICD-10-CM | POA: Diagnosis not present

## 2024-03-10 DIAGNOSIS — Z0000 Encounter for general adult medical examination without abnormal findings: Secondary | ICD-10-CM | POA: Diagnosis not present

## 2024-03-10 DIAGNOSIS — I700 Atherosclerosis of aorta: Secondary | ICD-10-CM | POA: Diagnosis not present

## 2024-03-10 DIAGNOSIS — J449 Chronic obstructive pulmonary disease, unspecified: Secondary | ICD-10-CM | POA: Diagnosis not present

## 2024-03-10 DIAGNOSIS — Z23 Encounter for immunization: Secondary | ICD-10-CM | POA: Diagnosis not present

## 2024-03-10 DIAGNOSIS — E785 Hyperlipidemia, unspecified: Secondary | ICD-10-CM | POA: Diagnosis not present

## 2024-03-10 DIAGNOSIS — E559 Vitamin D deficiency, unspecified: Secondary | ICD-10-CM | POA: Diagnosis not present

## 2024-03-10 DIAGNOSIS — F17210 Nicotine dependence, cigarettes, uncomplicated: Secondary | ICD-10-CM | POA: Diagnosis not present

## 2024-03-10 DIAGNOSIS — K8689 Other specified diseases of pancreas: Secondary | ICD-10-CM | POA: Diagnosis not present

## 2024-03-10 DIAGNOSIS — M810 Age-related osteoporosis without current pathological fracture: Secondary | ICD-10-CM | POA: Diagnosis not present

## 2024-03-10 DIAGNOSIS — I10 Essential (primary) hypertension: Secondary | ICD-10-CM | POA: Diagnosis not present

## 2024-04-01 DIAGNOSIS — J449 Chronic obstructive pulmonary disease, unspecified: Secondary | ICD-10-CM | POA: Diagnosis not present

## 2024-04-01 DIAGNOSIS — F17200 Nicotine dependence, unspecified, uncomplicated: Secondary | ICD-10-CM | POA: Diagnosis not present

## 2024-04-01 DIAGNOSIS — J069 Acute upper respiratory infection, unspecified: Secondary | ICD-10-CM | POA: Diagnosis not present

## 2024-05-14 ENCOUNTER — Telehealth: Payer: Self-pay

## 2024-05-14 NOTE — Telephone Encounter (Signed)
 A refill form from [employer] came in by fax.  They are requesting a yearly refill for Creon .  The patient has not been seen since 02/2023.  Does the patient need to be see in office?

## 2024-05-19 ENCOUNTER — Other Ambulatory Visit: Payer: Self-pay

## 2024-05-19 MED ORDER — PANCRELIPASE (LIP-PROT-AMYL) 36000-114000 UNITS PO CPEP: ORAL_CAPSULE | ORAL | 0 refills | Status: DC

## 2024-05-19 NOTE — Progress Notes (Signed)
 1 month refill sent.  Patient has a scheduled appt. for 10/24.

## 2024-05-21 ENCOUNTER — Ambulatory Visit: Admitting: Physician Assistant

## 2024-06-08 ENCOUNTER — Telehealth: Payer: Self-pay

## 2024-06-08 NOTE — Telephone Encounter (Signed)
 Patient dropped off [employer] assistance paperwork. Forms filled out & signed by Alan, [place]. Faxed.

## 2024-06-17 NOTE — Telephone Encounter (Signed)
 Received fax from [employer] that patient has been approved through 07/28/2025.

## 2024-07-14 ENCOUNTER — Encounter (INDEPENDENT_AMBULATORY_CARE_PROVIDER_SITE_OTHER): Payer: Self-pay

## 2024-07-15 NOTE — Progress Notes (Addendum)
 "    07/16/2024 Abigail Aguirre [id number] 1946-07-21  Referring provider: Teresa Channel, MD Primary GI doctor: Dr. Shila  ASSESSMENT AND PLAN:  IBS- D/EPI 09/20/2016 colonoscopy Dr. Dianna [facility] 1 tubular adenoma and 1 hyperplastic polyp was removed, recommended recall colonoscopy in 5 years March 2023 02/28/2020 office visit with Dr. Shila  referred to pelvic floor physical therapy 01/09/2023 CT abdomen pelvis with contrast  showed sigmoid diverticulosis, stable moderate HH Lower AB pain severe, improved with BM  Will last about a day, will try to take ibuprofen or tylenol  and it improves - Provided antispasmodic medication for as-needed use. - Provided samples of Ibogard (peppermint oil). - provided FODMAP diet  EPI possible 2024 pancreatic elastase low had good response to Creon  she has been out for the last month and having more diarrhea and gas, and cramping.  - give creon  samples - check on prescription - patient has benefit with medicaiton  GERD with dysphagia, smoking history, moderate hiatal hernia 10/24/2016 EGD LA grade B reflux esophagitis, small hiatal hernia and gastritis.  Barium swallow 2021 showed moderate esophageal dysmotility, hiatal hernia, GERD, no masses or strictures 03/2023 barium swallow some narrowing and irregularity at distal esophagus, barium tablet passed without difficulty.  CTA chest/AB/pelvis shows gallbladder wall thickening/trace fluid, no gallstone, no biliary dilatations ADDENDUM: 11/2023 EGD  Dr. Dianna at [facility]  Pathology negative H. pylori, positive columnar cells with chronic inflammation negative intestinal metaplasia PLAN: -Advised patient stop NSAIDs, quit smoking - restart prilosec 40 mg once daily, has been out for months and has had increasing dysphagia - get RUQ US , check LFTs - get records - follow up 3 months  Personal history of adenomatous colon polyps Last colonoscopy 2018 with Dr. Dianna Ee 1 adenomatous polyp no recall secondary to age Addendum 12/26/2023 colonoscopy Dr. Dianna diverticulosis sigmoid colon 2  TA and SS polyppolyps 4 to 10 mm ascending colon internal hemorrhoids  COPD Not on oxygen  Psoriasis On Rinvoq    Patient Care Team: Teresa Channel, MD as PCP - General (Family Medicine) Jeffrie Oneil BROCKS, MD as PCP - Cardiology (Cardiology)  HISTORY OF PRESENT ILLNESS: 78 y.o. female with a past medical history of anxiety, depression, hypertension, hyperlipidemia, COPD with emphysema, GERD with hiatal hernia, personal history of tubular adenomatous polyps, psoriasis and others listed below presents for evaluation of diarrhea weight loss.   I last saw the patient in the office 03/13/2023 for diarrhea found to have EPI responding well to Creon .  Discussed the use of AI scribe software for clinical note transcription with the patient, who gave verbal consent to proceed.  History of Present Illness   Abigail Aguirre is a 78 year old female with IBS and pancreatic insufficiency who presents with worsening gastrointestinal symptoms and difficulty swallowing.  She has been experiencing worsening gastrointestinal symptoms. She was approved for Creon  but has not received it yet, leading her to use it sparingly. Without Creon , she experiences more loose stools, gas, and cramping. She has had two episodes of severe lower abdominal pain in the last four to five months, sometimes requiring ibuprofen or Tylenol  for relief. The pain is often associated with attempts to have a bowel movement, which sometimes helps. Her bowel movements are variable, with no prolonged diarrhea or constipation, but she occasionally needs to manually assist stool passage.  She experiences intermittent difficulty swallowing, where her throat 'just closes up' and she has to wait for it to open again, often accompanied by burping.  This issue is sporadic. An endoscopy in 2018 showed inflammation  and a small hiatal hernia, and a barium swallow in 2021 showed dysmotility, hiatal hernia, and reflux. She is not currently taking any medication for reflux or heartburn.  She has noticed increased drooling and attributes some oral issues to past use of Fosamax and Reclast , which she believes damaged her teeth. She also reports dry mouth, which she associates with lisinopril use. She smokes about ten cigarettes a day.  Her husband was diagnosed with cancer in August and is undergoing treatment, which has been stressful for her. She mentions a history of weight fluctuations related to her eating habits, which have stabilized since June. She also reports a 'big tick' or jerking motion, but no other neurological symptoms.  Occasional heartburn and some right-sided abdominal pain. Experiences dry mouth and drooling.       Wt Readings from Last 3 Encounters:  07/16/24 118 lb (53.5 kg)  10/04/23 118 lb 6.2 oz (53.7 kg)  03/13/23 118 lb 6 oz (53.7 kg)   She denies blood thinner use.  She reports NSAID use, she is on aleve PRN, can be twice a week.  She denies ETOH use.   She reports tobacco use.  She denies drug use.    She  reports that she has been smoking cigarettes. She has a 40 pack-year smoking history. She has never used smokeless tobacco. She reports that she does not drink alcohol and does not use drugs.  RELEVANT LABS AND IMAGING:  09/02/2016 CT AB and pelvis diverticulitis sigmoid colon 09/20/2016 colonoscopy Dr. Dianna [facility] 1 tubular adenoma and 1 hyperplastic polyp was removed, recommended recall colonoscopy in 5 years March 2023 10/24/2016 EGD LA grade B reflux esophagitis, small hiatal hernia and gastritis.  02/28/2020 office visit with Dr. Nandigam as a new patient for evaluation of dysphagia, referred to pelvic floor physical therapy 03/10/2020 barium swallow showed small to moderate sliding-type hiatal hernia, nonspecific esophageal motility disorder and reflux, no  stricture or mass.  MBS normal with normal oropharyngeal function no aspiration. 01/09/2023 CT abdomen pelvis with contrast  showed sigmoid diverticulosis, stable moderate HH, new 9 mm right lower lobe pulmonary nodule recall 3 to 6 months  CBC    Component Value Date/Time   WBC 9.1 10/04/2023 1720   RBC 4.67 10/04/2023 1720   HGB 14.7 10/04/2023 1720   HGB 14.0 07/03/2021 1434   HCT 44.0 10/04/2023 1720   HCT 41.5 07/03/2021 1434   PLT 175 10/04/2023 1720   PLT 202 07/03/2021 1434   MCV 94.2 10/04/2023 1720   MCV 92 07/03/2021 1434   MCH 31.5 10/04/2023 1720   MCHC 33.4 10/04/2023 1720   RDW 12.6 10/04/2023 1720   RDW 12.5 07/03/2021 1434   LYMPHSABS 2.0 10/04/2023 1720   LYMPHSABS 3.3 (H) 07/03/2021 1434   MONOABS 0.7 10/04/2023 1720   EOSABS 0.3 10/04/2023 1720   EOSABS 0.6 (H) 07/03/2021 1434   BASOSABS 0.0 10/04/2023 1720   BASOSABS 0.1 07/03/2021 1434   Recent Labs    10/04/23 1720  HGB 14.7    CMP     Component Value Date/Time   NA 139 10/04/2023 1720   NA 147 (H) 07/03/2021 1434   K 3.4 (L) 10/04/2023 1720   CL 105 10/04/2023 1720   CO2 25 10/04/2023 1720   GLUCOSE 134 (H) 10/04/2023 1720   BUN 14 10/04/2023 1720   BUN 9 07/03/2021 1434   CREATININE 0.83 10/04/2023 1720   CALCIUM  9.8  10/04/2023 1720   PROT 6.6 10/04/2023 1720   PROT 6.8 07/03/2021 1434   ALBUMIN 3.7 10/04/2023 1720   ALBUMIN 4.1 07/03/2021 1434   AST 73 (H) 10/04/2023 1720   ALT 31 10/04/2023 1720   ALKPHOS 81 10/04/2023 1720   BILITOT 0.8 10/04/2023 1720   BILITOT <0.2 07/03/2021 1434   GFRNONAA >60 10/04/2023 1720   GFRAA >60 08/10/2016 1830      Latest Ref Rng & Units 10/04/2023    5:20 PM 01/29/2023   11:42 AM 01/08/2023   12:36 PM  Hepatic Function  Total Protein 6.5 - 8.1 g/dL 6.6  6.9  7.2   Albumin 3.5 - 5.0 g/dL 3.7  4.2  4.1   AST 15 - 41 U/L 73  16  59   ALT 0 - 44 U/L 31  11  44   Alk Phosphatase 38 - 126 U/L 81  71  99   Total Bilirubin 0.0 - 1.2 mg/dL 0.8  0.5  0.5        Current Medications:   Current Outpatient Medications (Cardiovascular):    lisinopril (PRINIVIL,ZESTRIL) 20 MG tablet, Take 20 mg by mouth every morning.  Current Outpatient Medications (Respiratory):    cetirizine (ZYRTEC) 10 MG tablet, Take 10 mg by mouth daily.  Current Outpatient Medications (Other):    AMBULATORY NON FORMULARY MEDICATION, Methylation Complete 1 daily B12, Folate, B6, P5P   clobetasol ointment (TEMOVATE) 0.05 %, Apply 1 Application topically.   hyoscyamine  (LEVSIN ) 0.125 MG tablet, Take 1 tablet (0.125 mg total) by mouth every 6 (six) hours as needed for cramping.   ketoconazole  (NIZORAL ) 2 % cream, Apply 1 Application topically daily.   MAGNESIUM GLYCINATE PO, Take 1 capsule by mouth daily.   Multiple Minerals-Vitamins (NUTRA-SUPPORT BONE PO), Take 1 capsule by mouth daily.   Multiple Vitamin (MULTIVITAMIN) capsule, Take 1 capsule by mouth daily.   nystatin  (MYCOSTATIN /NYSTOP ) powder, Apply topically 2 (two) times daily as needed.   potassium chloride  (KLOR-CON  M) 20 MEQ tablet, Take 1 tablet (20 mEq total) by mouth daily.   Probiotic Product (PROBIOTIC DAILY PO), Take 1 capsule by mouth daily.   lipase/protease/amylase (CREON ) 36000 UNITS CPEP capsule, Take 2 capsules (72,000 Units total) by mouth 3 (three) times daily with meals. May also take 1 capsule (36,000 Units total) as needed (with snacks).   omeprazole  (PRILOSEC) 40 MG capsule, Take 1 capsule (40 mg total) by mouth daily.  Medical History:  Past Medical History:  Diagnosis Date   Anxiety    Atrial fibrillation (HCC)    Depression    Diverticulosis    Emphysema lung (HCC)    mild   GERD (gastroesophageal reflux disease)    Hiatal hernia    HLD (hyperlipidemia)    Hypertension    IBS (irritable bowel syndrome)    Tubular adenoma of colon 10/24/2016   Allergies:  Allergies  Allergen Reactions   Desvenlafaxine     Other Reaction(s): dizzy   Duloxetine     Other Reaction(s): dizzy    Fluoxetine     Other Reaction(s): numb, Unknown   Penicillin G     Other Reaction(s): abdominal pain   Quetiapine     Other Reaction(s): dizzy, Unknown   Venlafaxine     Other Reaction(s): shaky, knot in stomach   Mercaptobenzothiazole Rash   Wellbutrin [Bupropion Hcl] Rash     Surgical History:  She  has a past surgical history that includes Tonsillectomy; Partial hysterectomy; Tubal ligation; and Colonoscopy. Family History:  Her family history includes Dementia in her mother; Hashimoto's thyroiditis in her son; Hearing loss in her sister; Heart attack in her father; Heart disease in her paternal grandfather; Hypertension in her mother and son; Lung cancer in her paternal grandfather; Other in her father; Stroke in her father.  REVIEW OF SYSTEMS  : All other systems reviewed and negative except where noted in the History of Present Illness.  PHYSICAL EXAM: BP (!) 110/56   Pulse 75   Ht 4' 9 (1.448 m)   Wt 118 lb (53.5 kg)   BMI 25.53 kg/m  Physical Exam   GENERAL APPEARANCE: Well nourished, in no apparent distress. HEENT: No cervical lymphadenopathy, unremarkable thyroid , sclerae anicteric, conjunctiva pink. RESPIRATORY: Respiratory effort normal, breath sounds equal bilaterally without rales, rhonchi, or wheezing. CARDIO: Regular rate and rhythm with no murmurs, rubs, or gallops, peripheral pulses intact. ABDOMEN: Soft, non-distended, active bowel sounds in all four quadrants, non-tender to palpation, no rebound, no mass appreciated. RECTAL: Declines. MUSCULOSKELETAL: Full range of motion, normal gait, without edema. SKIN: Dry, intact without rashes or lesions. No jaundice. NEURO: Alert, oriented, no focal deficits. PSYCH: Cooperative, normal mood and affect.        Abigail JONELLE Coombs, PA-C 11:45 AM   "

## 2024-07-16 ENCOUNTER — Ambulatory Visit: Admitting: Physician Assistant

## 2024-07-16 ENCOUNTER — Other Ambulatory Visit

## 2024-07-16 ENCOUNTER — Ambulatory Visit: Payer: Self-pay | Admitting: Physician Assistant

## 2024-07-16 ENCOUNTER — Encounter: Payer: Self-pay | Admitting: Physician Assistant

## 2024-07-16 VITALS — BP 110/56 | HR 75 | Ht <= 58 in | Wt 118.0 lb

## 2024-07-16 DIAGNOSIS — F17210 Nicotine dependence, cigarettes, uncomplicated: Secondary | ICD-10-CM

## 2024-07-16 DIAGNOSIS — R1310 Dysphagia, unspecified: Secondary | ICD-10-CM

## 2024-07-16 DIAGNOSIS — K8681 Exocrine pancreatic insufficiency: Secondary | ICD-10-CM | POA: Diagnosis not present

## 2024-07-16 DIAGNOSIS — K580 Irritable bowel syndrome with diarrhea: Secondary | ICD-10-CM | POA: Diagnosis not present

## 2024-07-16 DIAGNOSIS — R197 Diarrhea, unspecified: Secondary | ICD-10-CM

## 2024-07-16 DIAGNOSIS — K219 Gastro-esophageal reflux disease without esophagitis: Secondary | ICD-10-CM

## 2024-07-16 DIAGNOSIS — R7989 Other specified abnormal findings of blood chemistry: Secondary | ICD-10-CM

## 2024-07-16 DIAGNOSIS — Z1159 Encounter for screening for other viral diseases: Secondary | ICD-10-CM

## 2024-07-16 DIAGNOSIS — Z860101 Personal history of adenomatous and serrated colon polyps: Secondary | ICD-10-CM | POA: Diagnosis not present

## 2024-07-16 DIAGNOSIS — R932 Abnormal findings on diagnostic imaging of liver and biliary tract: Secondary | ICD-10-CM

## 2024-07-16 DIAGNOSIS — K5731 Diverticulosis of large intestine without perforation or abscess with bleeding: Secondary | ICD-10-CM

## 2024-07-16 LAB — COMPREHENSIVE METABOLIC PANEL WITH GFR
ALT: 9 U/L (ref 3–35)
AST: 13 U/L (ref 5–37)
Albumin: 4.3 g/dL (ref 3.5–5.2)
Alkaline Phosphatase: 63 U/L (ref 39–117)
BUN: 12 mg/dL (ref 6–23)
CO2: 29 meq/L (ref 19–32)
Calcium: 9.8 mg/dL (ref 8.4–10.5)
Chloride: 102 meq/L (ref 96–112)
Creatinine, Ser: 0.75 mg/dL (ref 0.40–1.20)
GFR: 75.45 mL/min
Glucose, Bld: 93 mg/dL (ref 70–99)
Potassium: 3.9 meq/L (ref 3.5–5.1)
Sodium: 139 meq/L (ref 135–145)
Total Bilirubin: 0.5 mg/dL (ref 0.2–1.2)
Total Protein: 6.9 g/dL (ref 6.0–8.3)

## 2024-07-16 LAB — CBC WITH DIFFERENTIAL/PLATELET
Basophils Absolute: 0.1 K/uL (ref 0.0–0.1)
Basophils Relative: 0.6 % (ref 0.0–3.0)
Eosinophils Absolute: 0.4 K/uL (ref 0.0–0.7)
Eosinophils Relative: 4.4 % (ref 0.0–5.0)
HCT: 44.6 % (ref 36.0–46.0)
Hemoglobin: 15.1 g/dL — ABNORMAL HIGH (ref 12.0–15.0)
Lymphocytes Relative: 33 % (ref 12.0–46.0)
Lymphs Abs: 2.8 K/uL (ref 0.7–4.0)
MCHC: 33.8 g/dL (ref 30.0–36.0)
MCV: 93.5 fl (ref 78.0–100.0)
Monocytes Absolute: 0.7 K/uL (ref 0.1–1.0)
Monocytes Relative: 7.9 % (ref 3.0–12.0)
Neutro Abs: 4.5 K/uL (ref 1.4–7.7)
Neutrophils Relative %: 54.1 % (ref 43.0–77.0)
Platelets: 150 K/uL (ref 150.0–400.0)
RBC: 4.77 Mil/uL (ref 3.87–5.11)
RDW: 13.3 % (ref 11.5–15.5)
WBC: 8.4 K/uL (ref 4.0–10.5)

## 2024-07-16 MED ORDER — OMEPRAZOLE 40 MG PO CPDR: 40.0000 mg | DELAYED_RELEASE_CAPSULE | Freq: Every day | ORAL | 1 refills | Status: AC

## 2024-07-16 MED ORDER — PANCRELIPASE (LIP-PROT-AMYL) 36000-114000 UNITS PO CPEP: ORAL_CAPSULE | ORAL | 1 refills | Status: AC

## 2024-07-16 MED ORDER — HYOSCYAMINE SULFATE 0.125 MG PO TABS: 0.1250 mg | ORAL_TABLET | Freq: Four times a day (QID) | ORAL | 0 refills | Status: AC | PRN

## 2024-07-16 NOTE — Patient Instructions (Signed)
 Your provider has requested that you have an abdominal x ray before leaving today. Please go to the basement floor to our Radiology  department for the test.  Your provider has requested that you go to the basement level for lab work before leaving today. Press B on the elevator. The lab is located at the first door on the left as you exit the elevator.  You have been scheduled for an abdominal ultrasound at [facility] go through the Emergency Room and tell them you are here for an outpatient ultrasound on 07-18-24 at 7am. Please arrive 30 minutes prior to your appointment for registration. Make certain not to have anything to eat or drink midnight prior to your appointment. Should you need to reschedule your appointment, please contact radiology at [phone]. This test typically takes about 30 minutes to perform.   Please follow up in 3 months. Give us  a call at [phone] to schedule an appointment. Please call us  in January to schedule a March appointment.   Please take your proton pump inhibitor medication, prilosec  Please take this medication 30 minutes to 1 hour before meals- this makes it more effective.  Avoid spicy and acidic foods Avoid fatty foods Limit your intake of coffee, tea, alcohol, and carbonated drinks Work to maintain a healthy weight Keep the head of the bed elevated at least 3 inches with blocks or a wedge pillow if you are having any nighttime symptoms Stay upright for 2 hours after eating Avoid meals and snacks three to four hours before bedtime Stop smoking  Can take LEVSIN AS needed for pain.  Can try IBGard for pain For IBS and peppermint oil.  Peppermint oil has been proven to be better than placebo for cramping for IBS Stop if it worsens heart burn or causes flushing of your face.  Ideally enteric coated peppermint oil capsules over the counter IBGard, can take 2 a day is best but if you got the oil, you can use 0.44ml or 180 mg of pepperment  oil up to 3 x a day.     FODMAP stands for fermentable oligo-, di-, mono-saccharides and polyols (1). These are the scientific terms used to classify groups of carbs that are difficult for our body to digest and that are notorious for triggering digestive symptoms like bloating, gas, loose stools and stomach pain.   You can try low FODMAP diet  - start with eliminating just one column at a time that you feel may be a trigger for you. - the table at the very bottom contains foods that are low in FODMAPs   Sometimes trying to eliminate the FODMAP's from your diet is difficult or tricky, if you are stuggling with trying to do the elimination diet you can try an enzyme.  There is a food enzymes that you sprinkle in or on your food that helps break down the FODMAP. You can read more about the enzyme by going to this site: https://fodzyme.com/   Exocrine Pancreatic Insufficiency (EPI) EPI is a condition in which your body doesn't provide enough pancreatic enzymes to properly digest your food (which can sometimes lead to some unpleasant digestive symptoms such as bloating, AB pain and loose stools, weight loss).   For many people, EPI is also a chronic lifelong condition.   That's why its so important to know what to expect with your EPI treatment, because with the right plan in place, EPI is manageable.  HOW DO I TAKE PANCREATIC ENZYMES?  Pancreatic  Enzyme Replacement therapy (Creon ) is only available through prescription and cannot be substituted with over the counter alternatives.   Your doctor will personalize your dose based on your weight, diet, and symptoms.  The number of capsules you take per meal will depend on your prescribed dose.  Creon  must be taken DURING every meal and snack.   Whether its a full meal or a snack, take Creon  every time you eat. Remember to follow your treatment plan closely and take Creon  exactly as prescribed - consistency is key!!  Dose adjustments are  normal with Creon .  Your doctor will start your on the dose they deem appropriate for you.   Remember to follow up with your doctor after the first two weeks to ensure your therapy is effective and you're managing your treatment appropriately.

## 2024-07-18 ENCOUNTER — Ambulatory Visit (HOSPITAL_COMMUNITY)
Admission: RE | Admit: 2024-07-18 | Discharge: 2024-07-18 | Disposition: A | Discharge status: Home / Self Care | Source: Ambulatory Visit | Attending: Physician Assistant | Admitting: Physician Assistant

## 2024-07-18 DIAGNOSIS — K219 Gastro-esophageal reflux disease without esophagitis: Secondary | ICD-10-CM | POA: Insufficient documentation

## 2024-07-30 ENCOUNTER — Other Ambulatory Visit: Payer: Self-pay

## 2024-07-30 ENCOUNTER — Telehealth: Payer: Self-pay

## 2024-07-30 DIAGNOSIS — R7989 Other specified abnormal findings of blood chemistry: Secondary | ICD-10-CM

## 2024-07-30 DIAGNOSIS — R932 Abnormal findings on diagnostic imaging of liver and biliary tract: Secondary | ICD-10-CM

## 2024-07-30 NOTE — Telephone Encounter (Signed)
-----   Message from Alan JONELLE Coombs sent at 07/30/2024  7:31 AM EST ----- Regarding: FW: US  elastography liver Please see results. Need the elastography to evaluate stiffness of liver and get better values. ----- Message ----- From: Xayasine, Melissa Sent: 07/28/2024   3:34 PM EST To: Alan JONELLE Coombs, PA-C; Melissa Xayasine Subject: US  elastography liver                          Called pt to scheduled US  elastography liver but patient wants to know why that is needed when she just had an US  on 12/21.  Is someone  from [facility] able to contact pt to advise why its needed - if needed again.

## 2024-07-30 NOTE — Telephone Encounter (Signed)
 Pt made aware of recommendations.  Pt was scheduled for the US  elestography  and why the additional US  is recommenced.  Pt scheduled for 08/09/2024 at 11:00 AM at [facility]. Nothing to eat or drink 8 hours prior. Pt made aware.  Pt verbalized understanding with all questions answered.

## 2024-07-30 NOTE — Telephone Encounter (Signed)
"  Left message for pt to call back   "

## 2024-07-30 NOTE — Therapy (Incomplete)
 " OUTPATIENT PHYSICAL THERAPY VESTIBULAR EVALUATION     Patient Name: Abigail Aguirre MRN: [id number] DOB:[birth year], 79 y.o., female Today's Date: 07/30/2024  END OF SESSION:   Past Medical History:  Diagnosis Date   Anxiety    Atrial fibrillation (HCC)    Depression    Diverticulosis    Emphysema lung (HCC)    mild   GERD (gastroesophageal reflux disease)    Hiatal hernia    HLD (hyperlipidemia)    Hypertension    IBS (irritable bowel syndrome)    Tubular adenoma of colon 10/24/2016   Past Surgical History:  Procedure Laterality Date   COLONOSCOPY     PARTIAL HYSTERECTOMY     tubal reversal and 1 ovary removed    TONSILLECTOMY     TUBAL LIGATION     There are no active problems to display for this patient.   PCP: Teresa Channel, MD REFERRING PROVIDER: Teresa Channel, MD  REFERRING DIAG: H81.11 (ICD-10-CM) - Benign paroxysmal vertigo, right ear  THERAPY DIAG:  No diagnosis found.  ONSET DATE: 07/13/2024  Rationale for Evaluation and Treatment: {HABREHAB:27488}  SUBJECTIVE:   SUBJECTIVE STATEMENT: *** Pt accompanied by: {accompnied:27141}  PERTINENT HISTORY: PMH: Anxiety, A fib, Depression, HLD, HTN  PCP noted symptoms during R DixHallpike  PAIN:  Are you having pain? {OPRCPAIN:27236}  PRECAUTIONS: {Therapy precautions:24002}  RED FLAGS: {PT Red Flags:29287}   WEIGHT BEARING RESTRICTIONS: {Yes ***/No:24003}  FALLS: Has patient fallen in last 6 months? {fallsyesno:27318}  LIVING ENVIRONMENT: Lives with: {OPRC lives with:25569::lives with their family} Lives in: {Lives in:25570} Stairs: {opstairs:27293} Has following equipment at home: {Assistive devices:23999}  PLOF: {PLOF:24004}  PATIENT GOALS: ***  OBJECTIVE:  Note: Objective measures were completed at Evaluation unless otherwise noted.  DIAGNOSTIC FINDINGS: ***  COGNITION: Overall cognitive status: {cognition:24006}   SENSATION: {sensation:27233}  EDEMA:   {edema:24020}  MUSCLE TONE:  {LE tone:25568}  DTRs:  {DTR SITE:24025}  POSTURE:  {posture:25561}  Cervical ROM:    {AROM/PROM:27142} A/PROM (deg) eval  Flexion   Extension   Right lateral flexion   Left lateral flexion   Right rotation   Left rotation   (Blank rows = not tested)  STRENGTH: ***  LOWER EXTREMITY MMT:   MMT Right eval Left eval  Hip flexion    Hip abduction    Hip adduction    Hip internal rotation    Hip external rotation    Knee flexion    Knee extension    Ankle dorsiflexion    Ankle plantarflexion    Ankle inversion    Ankle eversion    (Blank rows = not tested)  BED MOBILITY:  {Bed mobility:24027}  TRANSFERS: Assistive device utilized: {Assistive devices:23999}  Sit to stand: {Levels of assistance:24026} Stand to sit: {Levels of assistance:24026} Chair to chair: {Levels of assistance:24026} Floor: {Levels of assistance:24026}  RAMP: {Levels of assistance:24026}  CURB: {Levels of assistance:24026}  GAIT: Gait pattern: {gait characteristics:25376} Distance walked: *** Assistive device utilized: {Assistive devices:23999} Level of assistance: {Levels of assistance:24026} Comments: ***  FUNCTIONAL TESTS:  {Functional tests:24029}  PATIENT SURVEYS:  {rehab surveys:24030}  VESTIBULAR ASSESSMENT:  GENERAL OBSERVATION: ***   SYMPTOM BEHAVIOR:  Subjective history: ***  Non-Vestibular symptoms: {nonvestibular symptoms:25260}  Type of dizziness: {Type of Dizziness:25255}  Frequency: ***  Duration: ***  Aggravating factors: {Aggravating Factors:25258}  Relieving factors: {Relieving Factors:25259}  Progression of symptoms: {DESC; BETTER/WORSE:18575}  OCULOMOTOR EXAM:  Ocular Alignment: {Ocular Alignment:25262}  Ocular ROM: {RANGE OF MOTION:21649}  Spontaneous Nystagmus: {Spontaneous nystagmus:25263}  Gaze-Induced Nystagmus: {  gaze-induced nystagmus:25264}  Smooth Pursuits: {smooth pursuit:25265}  Saccades:  {saccades:25266}  Convergence/Divergence: *** cm   Cover-cross-cover test: {cover test:33756}  FRENZEL - FIXATION SUPRESSED:  Ocular Alignment: {Ocular Alignment:25262}  Spontaneous Nystagmus: {Spontaneous nystagmus:25263}  Gaze-Induced Nystagmus: {gaze-induced nystagmus:25264}  Horizontal head shaking - induced nystagmus: {head shaking induced nystagmus:25267}  Vertical head shaking - induced nystagmus: {head shaking induced nystagmus:25267}  Positional tests: {Positional tests:25271}  Pressure tests: {frenzel pressure tests:25268}  VESTIBULAR - OCULAR REFLEX:   Slow VOR: {slow VOR:25290}  VOR Cancellation: {vor cancellation:25291}  Head-Impulse Test: {head impulse test:25272}  Dynamic Visual Acuity: {dynamic visual acuity:25273}   POSITIONAL TESTING: {Positional tests:25271}  MOTION SENSITIVITY:  Motion Sensitivity Quotient Intensity: 0 = none, 1 = Lightheaded, 2 = Mild, 3 = Moderate, 4 = Severe, 5 = Vomiting  Intensity  1. Sitting to supine   2. Supine to L side   3. Supine to R side   4. Supine to sitting   5. L Hallpike-Dix   6. Up from L    7. R Hallpike-Dix   8. Up from R    9. Sitting, head tipped to L knee   10. Head up from L knee   11. Sitting, head tipped to R knee   12. Head up from R knee   13. Sitting head turns x5   14.Sitting head nods x5   15. In stance, 180 turn to L    16. In stance, 180 turn to R     OTHOSTATICS: {Exam; orthostatics:31331}  FUNCTIONAL GAIT: {Functional tests:24029}                                                                                                                             TREATMENT DATE: ***   Canalith Repositioning:  {Canalith Repositioning:25283} Gaze Adaptation:  {gaze adaptation:25286} Habituation:  {habituation:25288} Other: ***  PATIENT EDUCATION: Education details: *** Person educated: {Person educated:25204} Education method: {Education Method:25205} Education comprehension: {Education  Comprehension:25206}  HOME EXERCISE PROGRAM:  GOALS: Goals reviewed with patient? {yes/no:20286}  SHORT TERM GOALS: Target date: ***  *** Baseline: Goal status: {GOALSTATUS:25110}  2.  *** Baseline:  Goal status: {GOALSTATUS:25110}  3.  *** Baseline:  Goal status: {GOALSTATUS:25110}  4.  *** Baseline:  Goal status: {GOALSTATUS:25110}  5.  *** Baseline:  Goal status: {GOALSTATUS:25110}  6.  *** Baseline:  Goal status: {GOALSTATUS:25110}  LONG TERM GOALS: Target date: ***  *** Baseline:  Goal status: {GOALSTATUS:25110}  2.  *** Baseline:  Goal status: {GOALSTATUS:25110}  3.  *** Baseline:  Goal status: {GOALSTATUS:25110}  4.  *** Baseline:  Goal status: {GOALSTATUS:25110}  5.  *** Baseline:  Goal status: {GOALSTATUS:25110}  6.  *** Baseline:  Goal status: {GOALSTATUS:25110}  ASSESSMENT:  CLINICAL IMPRESSION: Patient is a *** y.o. *** who was seen today for physical therapy evaluation and treatment for ***.   OBJECTIVE IMPAIRMENTS: {opptimpairments:25111}.   ACTIVITY LIMITATIONS: {activitylimitations:27494}  PARTICIPATION LIMITATIONS: {participationrestrictions:25113}  PERSONAL FACTORS: {Personal factors:25162} are also affecting  patient's functional outcome.   REHAB POTENTIAL: {rehabpotential:25112}  CLINICAL DECISION MAKING: {clinical decision making:25114}  EVALUATION COMPLEXITY: {Evaluation complexity:25115}   PLAN:  PT FREQUENCY: {rehab frequency:25116}  PT DURATION: {rehab duration:25117}  PLANNED INTERVENTIONS: {rehab planned interventions:25118::97110-Therapeutic exercises,97530- Therapeutic [phone]- Neuromuscular re-education,97535- Self Rjmz,02859- Manual therapy,Patient/Family education}  PLAN FOR NEXT SESSION: ***   [place] LOISE Senate, PT, DPT 07/30/2024, 9:02 AM  "

## 2024-08-02 ENCOUNTER — Ambulatory Visit: Admitting: Physical Therapy

## 2024-08-04 ENCOUNTER — Other Ambulatory Visit (INDEPENDENT_AMBULATORY_CARE_PROVIDER_SITE_OTHER)

## 2024-08-04 DIAGNOSIS — R7989 Other specified abnormal findings of blood chemistry: Secondary | ICD-10-CM

## 2024-08-04 DIAGNOSIS — R932 Abnormal findings on diagnostic imaging of liver and biliary tract: Secondary | ICD-10-CM

## 2024-08-04 DIAGNOSIS — Z1159 Encounter for screening for other viral diseases: Secondary | ICD-10-CM | POA: Diagnosis not present

## 2024-08-04 LAB — IBC + FERRITIN
Ferritin: 28.4 ng/mL (ref 10.0–291.0)
Iron: 110 ug/dL (ref 42–145)
Saturation Ratios: 28.9 % (ref 20.0–50.0)
TIBC: 380.8 ug/dL (ref 250.0–450.0)
Transferrin: 272 mg/dL (ref 212.0–360.0)

## 2024-08-04 LAB — GAMMA GT: GGT: 11 U/L (ref 7–51)

## 2024-08-04 LAB — PROTIME-INR
INR: 1 ratio (ref 0.8–1.0)
Prothrombin Time: 10.8 s (ref 9.6–13.1)

## 2024-08-06 LAB — ANTI-NUCLEAR AB-TITER (ANA TITER): ANA Titer 1: 1:40 {titer} — ABNORMAL HIGH

## 2024-08-06 LAB — MITOCHONDRIAL ANTIBODIES: Mitochondrial M2 Ab, IgG: <=20 U

## 2024-08-06 LAB — HEPATITIS B SURFACE ANTIGEN: Hepatitis B Surface Ag: NONREACTIVE

## 2024-08-06 LAB — HEPATITIS B SURFACE ANTIBODY,QUALITATIVE: Hep B S Ab: NONREACTIVE

## 2024-08-06 LAB — IGA: Immunoglobulin A: 140 mg/dL (ref 70–320)

## 2024-08-06 LAB — HEPATITIS C ANTIBODY: Hepatitis C Ab: NONREACTIVE

## 2024-08-06 LAB — ANTI-SMOOTH MUSCLE ANTIBODY, IGG: Actin (Smooth Muscle) Antibody (IGG): <20 U

## 2024-08-06 LAB — TISSUE TRANSGLUTAMINASE, IGA: (tTG) Ab, IgA: <1 U/mL

## 2024-08-06 LAB — ANA: Anti Nuclear Antibody (ANA): POSITIVE — AB

## 2024-08-06 LAB — HEPATITIS A ANTIBODY, TOTAL: Hepatitis A AB,Total: NONREACTIVE

## 2024-08-06 LAB — IGG: IgG (Immunoglobin G), Serum: 681 mg/dL (ref 600–1540)

## 2024-08-09 ENCOUNTER — Ambulatory Visit: Payer: Self-pay | Admitting: Physician Assistant

## 2024-08-09 ENCOUNTER — Ambulatory Visit (HOSPITAL_COMMUNITY)
Admission: RE | Admit: 2024-08-09 | Discharge: 2024-08-09 | Disposition: A | Discharge status: Home / Self Care | Source: Ambulatory Visit | Attending: Physician Assistant | Admitting: Physician Assistant

## 2024-08-09 DIAGNOSIS — R7989 Other specified abnormal findings of blood chemistry: Secondary | ICD-10-CM | POA: Insufficient documentation

## 2024-08-09 DIAGNOSIS — Z1159 Encounter for screening for other viral diseases: Secondary | ICD-10-CM | POA: Diagnosis present

## 2024-08-09 DIAGNOSIS — R932 Abnormal findings on diagnostic imaging of liver and biliary tract: Secondary | ICD-10-CM | POA: Diagnosis present

## 2024-08-11 NOTE — Therapy (Signed)
 [facility] OUTPATIENT PHYSICAL THERAPY VESTIBULAR EVALUATION     Patient Name: Abigail Aguirre MRN: [id number] DOB:1945/11/14, 79 y.o., female Today's Date: 08/12/2024  END OF SESSION:  PT End of Session - 08/12/24 1322     Visit Number 1    Number of Visits 5    Date for Recertification  09/11/24    Authorization Type [employer]    PT Start Time 1319    PT Stop Time 1401    PT Time Calculation (min) 42 min    Activity Tolerance Patient tolerated treatment well    Behavior During Therapy WFL for tasks assessed/performed          Past Medical History:  Diagnosis Date   Anxiety    Atrial fibrillation (HCC)    Depression    Diverticulosis    Emphysema lung (HCC)    mild   GERD (gastroesophageal reflux disease)    Hiatal hernia    HLD (hyperlipidemia)    Hypertension    IBS (irritable bowel syndrome)    Tubular adenoma of colon 10/24/2016   Past Surgical History:  Procedure Laterality Date   COLONOSCOPY     PARTIAL HYSTERECTOMY     tubal reversal and 1 ovary removed    TONSILLECTOMY     TUBAL LIGATION     There are no active problems to display for this patient.   PCP: Teresa Channel, MD REFERRING PROVIDER: Teresa Channel, MD  REFERRING DIAG: H81.11 (ICD-10-CM) - Benign paroxysmal vertigo, right ear  THERAPY DIAG:  BPPV (benign paroxysmal positional vertigo), right  Unsteadiness on feet  Dizziness and giddiness  ONSET DATE: 07/13/2024  Rationale for Evaluation and Treatment: Rehabilitation  SUBJECTIVE:   SUBJECTIVE STATEMENT: Has ear trouble too and is seeing an ENT soon (08/31/24). Did the DixHallpike done on the R side and the room was spinning. Has had this going on for a long time, has been going on for a couple years. Feels a slight waviness when she walks, when she is driving. Feels a constant movement. Has had some spinning laying back in bed, but it is not a usual thing. Reports her eyes won't dilate. Occasionally will feel  unsteady when closing her eyes. Has a tic with her neck and sometimes it will get really bad.  Pt accompanied by: self and husband in lobby  PERTINENT HISTORY: PMH: Anxiety, A fib, Depression, HLD, HTN, hx of cataract surgeries in both eyes, osteoporosis  PCP noted symptoms during R DixHallpike  PAIN:  Are you having pain? No  Vitals:   08/12/24 1341  BP: (!) 173/70  Pulse: 75   Pt reports she did not take her BP medication today   PRECAUTIONS: Fall   FALLS: Has patient fallen in last 6 months? No, did fall onto sofa one time when vacuuming   LIVING ENVIRONMENT: Lives with: lives with their spouse Lives in: House/apartment Stairs: Yes: External: 3 in front, 2 in back steps; can reach both Has following equipment at home: None, reports has a sturdy handle for her tub   PLOF: Independent  PATIENT GOALS: Wants to figure out what's going on   OBJECTIVE:  Note: Objective measures were completed at Evaluation unless otherwise noted.  COGNITION: Overall cognitive status: Within functional limits for tasks assessed    Cervical ROM:   WFL  GAIT: Gait pattern: step through pattern Distance walked: Clinic distances  Assistive device utilized: None Level of assistance: Complete Independence Comments: No unsteadiness noted, able to ambulate out of  session with no issues after Epley  VESTIBULAR ASSESSMENT:  GENERAL OBSERVATION: Ambulates in with no AD    SYMPTOM BEHAVIOR:  Subjective history: See above  Non-Vestibular symptoms: pt with hx of hearing loss, cataract surgery, ears ring all the time   Type of dizziness: Imbalance (Disequilibrium), Spinning/Vertigo, and feels like a wave, feels it in her head a little bit   Frequency: 95% of time when up and moving around a lot with wave feeling, when out and about,  Duration: when up and doing things   Aggravating factors: with activity like when walking or driving a car (has a warped feeling when driving a car and things  are moving a little bit)  Relieving factors: no known relieving factors  Progression of symptoms: unchanged  OCULOMOTOR EXAM:  Ocular Alignment: normal  Ocular ROM: No Limitations  Spontaneous Nystagmus: absent  Gaze-Induced Nystagmus: absent  Smooth Pursuits: intact  Saccades: intact and noted a little harder going to the R  VESTIBULAR - OCULAR REFLEX:   Slow VOR: Normal  VOR Cancellation: Normal weird feeling   Head-Impulse Test: pt guarded, unable to assess, pt reporting feeling a little bit of a whoosh   POSITIONAL TESTING: Right Dix-Hallpike: upbeating, right nystagmus and rotary, mid, lasting approx 10 seconds                                                                                                                              TREATMENT DATE: 08/12/24  Therapeutic Activity:  Canalith Repositioning:  Epley Right: Number of Reps: 1, Response to Treatment: comment: did not get to re-assess due to time constraints at end of eval, and Comment: pt reporting feeling a little dizzy/off afterwards, but able to ambulate out of session with no issues   Provided education regarding BPPV etiology, purpose of treating with Epley maneuver, reoccurrence and how it affects balance. Discussed will re-check BPPV and once BPPV is cleared, then can further assess pt's balance   PATIENT EDUCATION: Education details: Clinical findings, POC (pt able to come 1x a week due to husband's health/medical appts), see above  Person educated: Patient Education method: Medical Illustrator Education comprehension: verbalized understanding and needs further education  HOME EXERCISE PROGRAM: Will provide at future session   GOALS: Goals reviewed with patient? Yes  SHORT TERM GOALS: ALL STGS = LTGS  LONG TERM GOALS: Target date: 09/09/2024  Pt will demo negative R posterior canalithiasis in order to demo decr dizziness.  Baseline:  Goal status: INITIAL  2.  FGA to be assessed with  LTG written.  Baseline:  Goal status: INITIAL  3.  mCTSIB to be assessed with LTG written.  Baseline:  Goal status: INITIAL    ASSESSMENT:  CLINICAL IMPRESSION: Patient is a 79 year old female referred to Neuro OPPT for BPPV.   Pt's PMH is significant for: Anxiety, A fib, Depression, HLD, HTN, hx of cataract surgeries in both eyes. Pt reporting feeling off balance >1  year and describes it was a wavy feeling.  The following deficits were present during the exam: weird feeling with VOR cancellation, and pt with upbeating rotary nystagmus in R DixHallpike lasting approx 10 seconds, indicating R posterior canalithiasis. Treated x1 rep of the Epley maneuver. Unable to re-assess at end of session due to time constraints. Pt may also have further vestibular dysfunction causing balance impairments as pt's unsteadiness has been going on for a while. Will further assess balance once BPPV is cleared. Based on BPPV, pt is an incr risk for falls. Pt would benefit from skilled PT to address these impairments and functional limitations to maximize functional mobility independence and decr dizziness/unsteadiness.   OBJECTIVE IMPAIRMENTS: decreased activity tolerance, decreased balance, and dizziness.   ACTIVITY LIMITATIONS: bed mobility and locomotion level  PARTICIPATION LIMITATIONS: driving, shopping, and community activity  PERSONAL FACTORS: Age, Behavior pattern, Past/current experiences, Time since onset of injury/illness/exacerbation, and 3+ comorbidities: Anxiety, A fib, Depression, HLD, HTN, hx of cataract surgeries in both eyes  are also affecting patient's functional outcome.   REHAB POTENTIAL: Good  CLINICAL DECISION MAKING: Evolving/moderate complexity  EVALUATION COMPLEXITY: Moderate   PLAN:  PT FREQUENCY: 1-2x/week  PT DURATION: 4 weeks  PLANNED INTERVENTIONS: [id number]- PT Re-evaluation, 97110-Therapeutic exercises, 97530- Therapeutic activity, 97112- Neuromuscular re-education,  97535- Self Care, 02859- Manual therapy, [phone]- Gait training, [phone]- Canalith repositioning, Patient/Family education, Balance training, and Vestibular training  PLAN FOR NEXT SESSION:  assess BP, re-check R posterior canal BPPV, once that is cleared further assess balance with FGA and mCTSIB    [place] LOISE Senate, PT, DPT 08/12/2024, 2:08 PM

## 2024-08-12 ENCOUNTER — Encounter: Payer: Self-pay | Admitting: Physical Therapy

## 2024-08-12 ENCOUNTER — Ambulatory Visit: Attending: Family Medicine | Admitting: Physical Therapy

## 2024-08-12 VITALS — BP 173/70 | HR 75

## 2024-08-12 DIAGNOSIS — R42 Dizziness and giddiness: Secondary | ICD-10-CM | POA: Insufficient documentation

## 2024-08-12 DIAGNOSIS — H8111 Benign paroxysmal vertigo, right ear: Secondary | ICD-10-CM | POA: Insufficient documentation

## 2024-08-12 DIAGNOSIS — R2681 Unsteadiness on feet: Secondary | ICD-10-CM | POA: Insufficient documentation

## 2024-08-20 ENCOUNTER — Ambulatory Visit: Admitting: Physical Therapy

## 2024-08-20 ENCOUNTER — Encounter: Payer: Self-pay | Admitting: Physical Therapy

## 2024-08-20 VITALS — BP 161/88 | HR 78

## 2024-08-20 DIAGNOSIS — R42 Dizziness and giddiness: Secondary | ICD-10-CM

## 2024-08-20 DIAGNOSIS — R2681 Unsteadiness on feet: Secondary | ICD-10-CM

## 2024-08-20 DIAGNOSIS — H8111 Benign paroxysmal vertigo, right ear: Secondary | ICD-10-CM

## 2024-08-20 NOTE — Therapy (Signed)
 " OUTPATIENT PHYSICAL THERAPY VESTIBULAR TREATMENT    Patient Name: Abigail Aguirre MRN: [id number] DOB:May 11, 1946, 79 y.o., female Today's Date: 08/20/2024  END OF SESSION:  PT End of Session - 08/20/24 1235     Visit Number 2    Number of Visits 5    Date for Recertification  09/11/24    Authorization Type [employer]    PT Start Time 1232    PT Stop Time 1300   full time not used due to pt needing to leave early   PT Time Calculation (min) 28 min    Activity Tolerance Patient tolerated treatment well    Behavior During Therapy WFL for tasks assessed/performed          Past Medical History:  Diagnosis Date   Anxiety    Atrial fibrillation (HCC)    Depression    Diverticulosis    Emphysema lung (HCC)    mild   GERD (gastroesophageal reflux disease)    Hiatal hernia    HLD (hyperlipidemia)    Hypertension    IBS (irritable bowel syndrome)    Tubular adenoma of colon 10/24/2016   Past Surgical History:  Procedure Laterality Date   COLONOSCOPY     PARTIAL HYSTERECTOMY     tubal reversal and 1 ovary removed    TONSILLECTOMY     TUBAL LIGATION     There are no active problems to display for this patient.   PCP: Teresa Channel, MD REFERRING PROVIDER: Teresa Channel, MD  REFERRING DIAG: H81.11 (ICD-10-CM) - Benign paroxysmal vertigo, right ear  THERAPY DIAG:  BPPV (benign paroxysmal positional vertigo), right  Unsteadiness on feet  Dizziness and giddiness  ONSET DATE: 07/13/2024  Rationale for Evaluation and Treatment: Rehabilitation  SUBJECTIVE:   SUBJECTIVE STATEMENT: Has been stressed running around for her husband's medical appts. Has to leave a little early today to go to [employer]. Notes dizziness is the same since she was last heere. Feeling a little whirly. Has not had any spinning, at least not lately. No falls. Will see the ENT next week for fullness in her inner ear.   Pt accompanied by: self and husband in  lobby  PERTINENT HISTORY: PMH: Anxiety, A fib, Depression, HLD, HTN, hx of cataract surgeries in both eyes, osteoporosis  PCP noted symptoms during R DixHallpike  PAIN:  Are you having pain? No  Vitals:   08/20/24 1239  BP: (!) 161/88  Pulse: 78    Pt reports she did not take her BP medication today   PRECAUTIONS: Fall   FALLS: Has patient fallen in last 6 months? No, did fall onto sofa one time when vacuuming   LIVING ENVIRONMENT: Lives with: lives with their spouse Lives in: House/apartment Stairs: Yes: External: 3 in front, 2 in back steps; can reach both Has following equipment at home: None, reports has a sturdy handle for her tub   PLOF: Independent  PATIENT GOALS: Wants to figure out what's going on   OBJECTIVE:  Note: Objective measures were completed at Evaluation unless otherwise noted.  COGNITION: Overall cognitive status: Within functional limits for tasks assessed    Cervical ROM:   WFL  GAIT: Gait pattern: step through pattern Distance walked: Clinic distances  Assistive device utilized: None Level of assistance: Complete Independence Comments: No unsteadiness noted, able to ambulate out of session with no issues after Epley  VESTIBULAR ASSESSMENT:  GENERAL OBSERVATION: Ambulates in with no AD    SYMPTOM BEHAVIOR:  Subjective history:  See above  Non-Vestibular symptoms: pt with hx of hearing loss, cataract surgery, ears ring all the time   Type of dizziness: Imbalance (Disequilibrium), Spinning/Vertigo, and feels like a wave, feels it in her head a little bit   Frequency: 95% of time when up and moving around a lot with wave feeling, when out and about,  Duration: when up and doing things   Aggravating factors: with activity like when walking or driving a car (has a warped feeling when driving a car and things are moving a little bit)  Relieving factors: no known relieving factors  Progression of symptoms: unchanged  OCULOMOTOR  EXAM:  Ocular Alignment: normal  Ocular ROM: No Limitations  Spontaneous Nystagmus: absent  Gaze-Induced Nystagmus: absent  Smooth Pursuits: intact  Saccades: intact and noted a little harder going to the R  VESTIBULAR - OCULAR REFLEX:   Slow VOR: Normal  VOR Cancellation: Normal weird feeling   Head-Impulse Test: pt guarded, unable to assess, pt reporting feeling a little bit of a whoosh   POSITIONAL TESTING: Right Dix-Hallpike: upbeating, right nystagmus and rotary, mid, lasting approx 10 seconds                                                                                                                              TREATMENT DATE: 08/20/24  Therapeutic Activity/NMR:  Vitals:   08/20/24 1239  BP: (!) 161/88  Pulse: 78   Pt reports she has been more stressed, has not yet taken her BP medication today, educated for pt to take it once she gets home   Reports having a weird feeling in the L side of her head and fullness/tinnitus in L ear, seeing the ENT regarding this next week  POSITIONAL TESTING: Right Dix-Hallpike: upbeating, right nystagmus and rotary, brief and a few beats with pt reporting spinning dizziness, had a delay of approx 8 seconds. When re-assessing after 1 rep of Epley, pt did not have any nystagmus, but did report subjective spinning  Right Sidelying: no nystagmus and did report some dizziness in this position lasting about 20 seconds and feeling woozy when sitting back to midline  Left Sidelying: no nystagmus and pt not reporting any sx   Canalith Repositioning:  Epley Right: Number of Reps: 2, Response to Treatment: comment: symptoms improved after 1st rep, and Comment: pt with dizziness and reporting room spinning in 1st position, pt also reporting feeling swimmy headed when coming up to sitting at the end of each maneuver    After performing the epley 2 times, when re-assessing did not notice any nystagmus, although pt still reporting some  spinning. Provided Wilhelmena Carrel to R side for HEP for habituation as pt with sx in R sidelying and return to seated    PATIENT EDUCATION: Education details: BPPV education, Wilhelmena Carrel for HEP  Person educated: Patient Education method: Explanation, Demonstration, Verbal cues, and Handouts Education comprehension: verbalized understanding, returned demonstration, and needs further education  HOME EXERCISE PROGRAM: R sided Wilhelmena Carrel   GOALS: Goals reviewed with patient? Yes  SHORT TERM GOALS: ALL STGS = LTGS  LONG TERM GOALS: Target date: 09/09/2024  Pt will demo negative R posterior canalithiasis in order to demo decr dizziness.  Baseline:  Goal status: INITIAL  2.  FGA to be assessed with LTG written.  Baseline:  Goal status: INITIAL  3.  mCTSIB to be assessed with LTG written.  Baseline:  Goal status: INITIAL    ASSESSMENT:  CLINICAL IMPRESSION: Session limited today due to pt needing to leave early for one of her husband's appts at [facility]. Re-checked R DixHallpike with pt having a delay in nystagmus and was very brief only lasting a few beats. Treated with 1 rep of the Epley maneuver. When re-assessed, did not notice nystagmus, but pt did report subjective spinning and it wasn't as bad as the first time. Treated with another rep of the R Epley. With re-assessment afterwards in R DixHallpike and R sidelying, pt with no nystagmus, but did report some dizziness. Provided R sided Wilhelmena Carrel for HEP for habituation. Will continue per POC.    OBJECTIVE IMPAIRMENTS: decreased activity tolerance, decreased balance, and dizziness.   ACTIVITY LIMITATIONS: bed mobility and locomotion level  PARTICIPATION LIMITATIONS: driving, shopping, and community activity  PERSONAL FACTORS: Age, Behavior pattern, Past/current experiences, Time since onset of injury/illness/exacerbation, and 3+ comorbidities: Anxiety, A fib, Depression, HLD, HTN, hx of cataract surgeries in both  eyes  are also affecting patient's functional outcome.   REHAB POTENTIAL: Good  CLINICAL DECISION MAKING: Evolving/moderate complexity  EVALUATION COMPLEXITY: Moderate   PLAN:  PT FREQUENCY: 1-2x/week  PT DURATION: 4 weeks  PLANNED INTERVENTIONS: 97164- PT Re-evaluation, 97110-Therapeutic exercises, 97530- Therapeutic activity, 97112- Neuromuscular re-education, 97535- Self Care, [id number]- Manual therapy, [id number]- Gait training, [phone]- Canalith repositioning, Patient/Family education, Balance training, and Vestibular training  PLAN FOR NEXT SESSION:  assess BP, re-check R posterior canal BPPV, how was brandt daroff?  assess balance with FGA and mCTSIB once BPPV cleared    Sheffield LOISE Senate, PT, DPT 08/20/2024, 1:16 PM  "

## 2024-08-27 ENCOUNTER — Ambulatory Visit: Admitting: Physical Therapy

## 2024-08-31 ENCOUNTER — Ambulatory Visit (INDEPENDENT_AMBULATORY_CARE_PROVIDER_SITE_OTHER): Admitting: Otolaryngology

## 2024-08-31 ENCOUNTER — Encounter (INDEPENDENT_AMBULATORY_CARE_PROVIDER_SITE_OTHER): Payer: Self-pay | Admitting: Otolaryngology

## 2024-08-31 VITALS — BP 99/64 | Ht <= 58 in | Wt 118.0 lb

## 2024-08-31 DIAGNOSIS — R0981 Nasal congestion: Secondary | ICD-10-CM | POA: Diagnosis not present

## 2024-08-31 DIAGNOSIS — H938X2 Other specified disorders of left ear: Secondary | ICD-10-CM | POA: Diagnosis not present

## 2024-08-31 DIAGNOSIS — R42 Dizziness and giddiness: Secondary | ICD-10-CM | POA: Diagnosis not present

## 2024-08-31 DIAGNOSIS — F17200 Nicotine dependence, unspecified, uncomplicated: Secondary | ICD-10-CM | POA: Diagnosis not present

## 2024-08-31 DIAGNOSIS — H9193 Unspecified hearing loss, bilateral: Secondary | ICD-10-CM | POA: Diagnosis not present

## 2024-08-31 DIAGNOSIS — J310 Chronic rhinitis: Secondary | ICD-10-CM

## 2024-08-31 DIAGNOSIS — H6522 Chronic serous otitis media, left ear: Secondary | ICD-10-CM

## 2024-08-31 DIAGNOSIS — H6992 Unspecified Eustachian tube disorder, left ear: Secondary | ICD-10-CM | POA: Diagnosis not present

## 2024-08-31 MED ORDER — FLUTICASONE PROPIONATE 50 MCG/ACT NA SUSP: 2.0000 | Freq: Two times a day (BID) | NASAL | 6 refills | Status: AC

## 2024-08-31 MED ORDER — AZELASTINE HCL 0.1 % NA SOLN: 2.0000 | Freq: Two times a day (BID) | NASAL | 12 refills | Status: AC

## 2024-08-31 MED ORDER — PREDNISONE 20 MG PO TABS: 20.0000 mg | ORAL_TABLET | Freq: Every day | ORAL | 0 refills | Status: AC

## 2024-08-31 NOTE — Patient Instructions (Addendum)
 Use two sprays of flonase  in each nostril twice per day  right after, use astelin  spray two sprays each nostril twice per day  Take Prednisone  by mouth 20mg  x7 days (2 pills). Take in morning. Risks discussed  Aureliano Med Nasal Saline Rinse - use daily (You can also use a Neti Pot) - start nasal saline rinses with NeilMed Bottle available over the counter    Nasal Saline Irrigation instructions: If you choose to make your own salt water solution, You will need: Salt (kosher, canning, or pickling salt) Baking soda Nasal irrigation bottle (i.e. Aureliano Med Sinus Rinse) Measuring spoon ( teaspoon) Distilled / boiled water   Mix solution Mix 1 teaspoon of salt, 1/2 teaspoon of baking soda and 1 cup of water into irrigation bottle ** May use saline packet instead of homemade recipe for this step if you prefer If medicine was prescribed to be mixed with solution, place this into bottle Examples 2 inches of 2% mupirocin ointment Budesonide solution Position your head: Lean over sink (about 45 degrees) Rotate head (about 45 degrees) so that one nostril is above the other Irrigate Insert tip of irrigation bottle into upper nostril so it forms a comfortable seal Irrigate while breathing through your mouth May remove the straw from the bottle in order to irrigate the entire solution (important if medicine was added) Exhale through nose when finished and blow nose as necessary  Repeat on opposite side with other 1/2 of solution (120 mL) or remake solution if all 240 mL was used on first side Wash irrigation bottle regularly, replace every 3 months

## 2024-09-03 ENCOUNTER — Ambulatory Visit: Admitting: Physical Therapy

## 2024-09-03 ENCOUNTER — Encounter: Payer: Self-pay | Admitting: Physical Therapy

## 2024-09-03 VITALS — BP 145/70 | HR 77

## 2024-09-03 DIAGNOSIS — R42 Dizziness and giddiness: Secondary | ICD-10-CM

## 2024-09-03 DIAGNOSIS — R2681 Unsteadiness on feet: Secondary | ICD-10-CM

## 2024-09-03 NOTE — Therapy (Incomplete)
 " OUTPATIENT PHYSICAL THERAPY VESTIBULAR TREATMENT/DISCHARGE SUMMARY    Patient Name: Abigail Aguirre MRN: [id number] DOB:1946-02-17, 79 y.o., female Today's Date: 09/03/2024  END OF SESSION:  PT End of Session - 09/03/24 1104     Visit Number 3    Number of Visits 5    Date for Recertification  09/11/24    Authorization Type [employer]    PT Start Time 1103    PT Stop Time 1141    PT Time Calculation (min) 38 min    Activity Tolerance Patient tolerated treatment well    Behavior During Therapy WFL for tasks assessed/performed          Past Medical History:  Diagnosis Date   Anxiety    Atrial fibrillation (HCC)    Depression    Diverticulosis    Emphysema lung (HCC)    mild   GERD (gastroesophageal reflux disease)    Hiatal hernia    HLD (hyperlipidemia)    Hypertension    IBS (irritable bowel syndrome)    Tubular adenoma of colon 10/24/2016   Past Surgical History:  Procedure Laterality Date   COLONOSCOPY     PARTIAL HYSTERECTOMY     tubal reversal and 1 ovary removed    TONSILLECTOMY     TUBAL LIGATION     There are no active problems to display for this patient.   PCP: Teresa Channel, MD REFERRING PROVIDER: Teresa Channel, MD  REFERRING DIAG: H81.11 (ICD-10-CM) - Benign paroxysmal vertigo, right ear  THERAPY DIAG:  Unsteadiness on feet  Dizziness and giddiness  ONSET DATE: 07/13/2024  Rationale for Evaluation and Treatment: Rehabilitation  SUBJECTIVE:   SUBJECTIVE STATEMENT: Things have felt a little bit. Saw Dr. Tobie last week for her fullness in her ears. Was prescribed Prednisone  and was instructed to use the neti pot daily. There has been a lot of stress with her husband and undergoing chemo. Still has some ear ringing. Not a whole lot has changed. Doesn't notice the vertigo as much at home, but does notice it more when she is out. Haven't been able to try the [facility] exercise much, but not often enough to say  much about it. Is sleeping so-so. Has a little bit of the wavy feeling right now.   Pt accompanied by: self and husband in lobby  PERTINENT HISTORY: PMH: Anxiety, A fib, Depression, HLD, HTN, hx of cataract surgeries in both eyes, osteoporosis  PCP noted symptoms during R DixHallpike  PAIN:  Are you having pain? No  Vitals:   09/03/24 1115  BP: (!) 145/70  Pulse: 77     Pt reports she did not take her BP medication today   PRECAUTIONS: Fall   FALLS: Has patient fallen in last 6 months? No, did fall onto sofa one time when vacuuming   LIVING ENVIRONMENT: Lives with: lives with their spouse Lives in: House/apartment Stairs: Yes: External: 3 in front, 2 in back steps; can reach both Has following equipment at home: None, reports has a sturdy handle for her tub   PLOF: Independent  PATIENT GOALS: Wants to figure out what's going on   OBJECTIVE:  Note: Objective measures were completed at Evaluation unless otherwise noted.  COGNITION: Overall cognitive status: Within functional limits for tasks assessed    Cervical ROM:   WFL  GAIT: Gait pattern: step through pattern Distance walked: Clinic distances  Assistive device utilized: None Level of assistance: Complete Independence Comments: No unsteadiness noted, able to ambulate out  of session with no issues after Epley  VESTIBULAR ASSESSMENT:  GENERAL OBSERVATION: Ambulates in with no AD    SYMPTOM BEHAVIOR:  Subjective history: See above  Non-Vestibular symptoms: pt with hx of hearing loss, cataract surgery, ears ring all the time   Type of dizziness: Imbalance (Disequilibrium), Spinning/Vertigo, and feels like a wave, feels it in her head a little bit   Frequency: 95% of time when up and moving around a lot with wave feeling, when out and about,  Duration: when up and doing things   Aggravating factors: with activity like when walking or driving a car (has a warped feeling when driving a car and things are  moving a little bit)  Relieving factors: no known relieving factors  Progression of symptoms: unchanged  OCULOMOTOR EXAM:  Ocular Alignment: normal  Ocular ROM: No Limitations  Spontaneous Nystagmus: absent  Gaze-Induced Nystagmus: absent  Smooth Pursuits: intact  Saccades: intact and noted a little harder going to the R  VESTIBULAR - OCULAR REFLEX:   Slow VOR: Normal  VOR Cancellation: Normal weird feeling   Head-Impulse Test: pt guarded, unable to assess, pt reporting feeling a little bit of a whoosh   POSITIONAL TESTING: Right Dix-Hallpike: upbeating, right nystagmus and rotary, mid, lasting approx 10 seconds                                                                                                                              TREATMENT DATE: 09/03/24  Therapeutic Activity/NMR:  Vitals:   09/03/24 1115  BP: (!) 145/70  Pulse: 77      M-CTSIB  Condition 1: Firm Surface, EO 30 Sec, Normal Sway  Condition 2: Firm Surface, EC 30 Sec, Mild Sway  Condition 3: Foam Surface, EO 30 Sec, Normal Sway  Condition 4: Foam Surface, EC 2 Sec     On air ex: Feet narrow BOS: EO 2 x 10 reps head turns, 2 x 10 reps head nods Wide BOS EC 30 seconds   Reviewed R Brandt Daroff exercise, performed 2 reps In first rep, pt did have mild R upbeating rotary nystagmus lasting 10 seconds, during 2nd rep, pt not having any nystagmus but did have some dizziness   PATIENT EDUCATION: Education details: BPPV education, Wilhelmena Carrel for HEP  Person educated: Patient Education method: Programmer, Multimedia, Facilities Manager, Verbal cues, and Handouts Education comprehension: verbalized understanding, returned demonstration, and needs further education  HOME EXERCISE PROGRAM: R sided Wilhelmena Carrel   Access Code: [email or web address] URL: [email or web address] Date: 09/03/2024 Prepared by: Sheffield Senate  Exercises - Wide Stance with Eyes Closed on Foam Pad  - 1 x daily - 5 x weekly - 3 sets  - 30 hold - Romberg Stance on Foam Pad with Head Rotation  - 1 x daily - 5 x weekly - 2 sets - 10 reps  GOALS: Goals reviewed with patient? Yes  SHORT TERM GOALS: ALL STGS = LTGS  LONG TERM GOALS: Target date: 09/09/2024  Pt will demo negative R posterior canalithiasis in order to demo decr dizziness.  Baseline:  Goal status: INITIAL  2.  FGA to be assessed with LTG written.  Baseline:  Goal status: INITIAL  3.  mCTSIB to be assessed with LTG written.  Baseline:  Goal status: INITIAL    ASSESSMENT:  CLINICAL IMPRESSION: Session limited today due to pt needing to leave early for one of her husband's appts at [facility]. Re-checked R DixHallpike with pt having a delay in nystagmus and was very brief only lasting a few beats. Treated with 1 rep of the Epley maneuver. When re-assessed, did not notice nystagmus, but pt did report subjective spinning and it wasn't as bad as the first time. Treated with another rep of the R Epley. With re-assessment afterwards in R DixHallpike and R sidelying, pt with no nystagmus, but did report some dizziness. Provided R sided Wilhelmena Carrel for HEP for habituation. Will continue per POC.    OBJECTIVE IMPAIRMENTS: decreased activity tolerance, decreased balance, and dizziness.   ACTIVITY LIMITATIONS: bed mobility and locomotion level  PARTICIPATION LIMITATIONS: driving, shopping, and community activity  PERSONAL FACTORS: Age, Behavior pattern, Past/current experiences, Time since onset of injury/illness/exacerbation, and 3+ comorbidities: Anxiety, A fib, Depression, HLD, HTN, hx of cataract surgeries in both eyes  are also affecting patient's functional outcome.   REHAB POTENTIAL: Good  CLINICAL DECISION MAKING: Evolving/moderate complexity  EVALUATION COMPLEXITY: Moderate   PLAN:  PT FREQUENCY: 1-2x/week  PT DURATION: 4 weeks  PLANNED INTERVENTIONS: 97164- PT Re-evaluation, 97110-Therapeutic exercises, 97530- Therapeutic activity, 97112-  Neuromuscular re-education, 97535- Self Care, 02859- Manual therapy, [phone]- Gait training, [phone]- Canalith repositioning, Patient/Family education, Balance training, and Vestibular training  PLAN FOR NEXT SESSION:  assess BP, re-check R posterior canal BPPV, how was brandt daroff?  assess balance with FGA and mCTSIB once BPPV cleared    Sheffield LOISE Senate, PT, DPT 09/03/2024, 11:44 AM  "

## 2024-09-10 ENCOUNTER — Ambulatory Visit: Admitting: Physical Therapy

## 2024-10-25 ENCOUNTER — Ambulatory Visit (INDEPENDENT_AMBULATORY_CARE_PROVIDER_SITE_OTHER): Admitting: Audiology

## 2024-10-25 ENCOUNTER — Ambulatory Visit (INDEPENDENT_AMBULATORY_CARE_PROVIDER_SITE_OTHER): Admitting: Otolaryngology
